# Patient Record
Sex: Female | Born: 1955 | Race: White | Hispanic: No | Smoking: Former smoker
Health system: Southern US, Community
[De-identification: ages and names within clinical notes are randomized; demographics above are authoritative.]

## PROBLEM LIST (undated history)

## (undated) DIAGNOSIS — D649 Anemia, unspecified: Secondary | ICD-10-CM

## (undated) DIAGNOSIS — E039 Hypothyroidism, unspecified: Secondary | ICD-10-CM

## (undated) DIAGNOSIS — I10 Essential (primary) hypertension: Secondary | ICD-10-CM

## (undated) DIAGNOSIS — E785 Hyperlipidemia, unspecified: Secondary | ICD-10-CM

## (undated) DIAGNOSIS — K5792 Diverticulitis of intestine, part unspecified, without perforation or abscess without bleeding: Secondary | ICD-10-CM

## (undated) DIAGNOSIS — C801 Malignant (primary) neoplasm, unspecified: Secondary | ICD-10-CM

## (undated) DIAGNOSIS — N189 Chronic kidney disease, unspecified: Secondary | ICD-10-CM

## (undated) DIAGNOSIS — E119 Type 2 diabetes mellitus without complications: Secondary | ICD-10-CM

## (undated) DIAGNOSIS — K219 Gastro-esophageal reflux disease without esophagitis: Secondary | ICD-10-CM

## (undated) HISTORY — PX: HERNIA REPAIR: SHX51

## (undated) HISTORY — PX: PARATHYROIDECTOMY: SHX19

## (undated) HISTORY — PX: LITHOTRIPSY: SUR834

## (undated) HISTORY — PX: COLON SURGERY: SHX602

## (undated) HISTORY — PX: THYROIDECTOMY: SHX17

---

## 1997-07-04 HISTORY — PX: TOTAL ABDOMINAL HYSTERECTOMY: SHX209

## 1997-07-04 HISTORY — PX: ABDOMINAL HYSTERECTOMY: SHX81

## 2005-06-22 ENCOUNTER — Emergency Department: Payer: Self-pay | Admitting: Internal Medicine

## 2006-03-20 ENCOUNTER — Ambulatory Visit: Payer: Self-pay | Admitting: Internal Medicine

## 2006-03-20 ENCOUNTER — Ambulatory Visit: Payer: Self-pay | Admitting: Urology

## 2006-04-06 ENCOUNTER — Ambulatory Visit: Payer: Self-pay | Admitting: Urology

## 2008-03-25 ENCOUNTER — Ambulatory Visit: Payer: Self-pay | Admitting: Gastroenterology

## 2009-07-04 HISTORY — PX: SIGMOIDECTOMY: SHX176

## 2010-01-22 ENCOUNTER — Ambulatory Visit: Payer: Self-pay | Admitting: Obstetrics and Gynecology

## 2010-03-19 ENCOUNTER — Ambulatory Visit: Payer: Self-pay | Admitting: Surgery

## 2010-03-22 LAB — PATHOLOGY REPORT

## 2010-03-30 ENCOUNTER — Ambulatory Visit: Payer: Self-pay | Admitting: Surgery

## 2010-04-22 ENCOUNTER — Inpatient Hospital Stay: Payer: Self-pay | Admitting: Surgery

## 2010-04-23 LAB — PATHOLOGY REPORT

## 2010-06-03 ENCOUNTER — Ambulatory Visit: Payer: Self-pay | Admitting: Urology

## 2010-06-29 ENCOUNTER — Ambulatory Visit: Payer: Self-pay | Admitting: Urology

## 2010-07-04 DIAGNOSIS — C801 Malignant (primary) neoplasm, unspecified: Secondary | ICD-10-CM

## 2010-07-04 HISTORY — DX: Malignant (primary) neoplasm, unspecified: C80.1

## 2011-02-22 ENCOUNTER — Ambulatory Visit: Payer: Self-pay | Admitting: Surgery

## 2011-05-11 ENCOUNTER — Ambulatory Visit: Payer: Self-pay | Admitting: Surgery

## 2011-05-16 LAB — PATHOLOGY REPORT

## 2011-06-06 ENCOUNTER — Ambulatory Visit: Payer: Self-pay | Admitting: Family Medicine

## 2012-03-14 ENCOUNTER — Ambulatory Visit: Payer: Self-pay | Admitting: Surgery

## 2012-03-14 DIAGNOSIS — I10 Essential (primary) hypertension: Secondary | ICD-10-CM

## 2012-03-21 ENCOUNTER — Ambulatory Visit: Payer: Self-pay | Admitting: Surgery

## 2012-06-06 ENCOUNTER — Ambulatory Visit: Payer: Self-pay | Admitting: Family Medicine

## 2013-06-07 ENCOUNTER — Ambulatory Visit: Payer: Self-pay | Admitting: Family Medicine

## 2014-08-27 ENCOUNTER — Ambulatory Visit: Payer: Self-pay | Admitting: Family Medicine

## 2014-10-02 ENCOUNTER — Ambulatory Visit
Admit: 2014-10-02 | Disposition: A | Discharge status: Home / Self Care | Payer: Self-pay | Attending: Otolaryngology | Admitting: Otolaryngology

## 2014-10-15 ENCOUNTER — Ambulatory Visit
Admit: 2014-10-15 | Disposition: A | Discharge status: Home / Self Care | Payer: Self-pay | Attending: Otolaryngology | Admitting: Otolaryngology

## 2014-10-21 NOTE — Op Note (Signed)
PATIENT NAME:  Susan Pineda, Susan Pineda MR#:  [id number] DATE OF BIRTH:  [birth year]  DATE OF PROCEDURE:  03/21/2012  PREOPERATIVE DIAGNOSIS: Recurrent ventral hernia.  POSTOPERATIVE DIAGNOSIS: Recurrent ventral hernia.   PROCEDURE: Repair of recurrent ventral hernia.   SURGEON: Rochel Brome, MD  ANESTHESIA: General.   INDICATIONS: This 59 year old female recently came in with bulging in the lower abdomen. She had had a prior hysterectomy and prior ventral hernia repair with Gore-Tex Bio-A mesh. She preferred not to have any permanent mesh used. She had bulging demonstrated on physical exam and repair was recommended for definitive treatment.   DESCRIPTION OF PROCEDURE: The patient was placed on the operating table in the supine position under general anesthesia. The abdomen was prepared with ChloraPrep and draped in a sterile manner.   A lower abdominal midline incision was made at the site of an old scar, carried down through subcutaneous tissues, extended down approximately 4 cm to encounter a ventral hernia sac which was dissected free from surrounding structures. It was separated from surrounding fascia and a portion of the sac was excised and did not need to be sent for pathology and the defect remaining was closed with 2-0 Vicryl. The peritoneum was pushed back away from the rectus muscle bilaterally and also dissected down just below the pubic tubercle and up to the upper extent of the fascial defect proximally. It appeared that the defect was approximately 7 cm in dimension from top to bottom. It appeared that the rectus muscles could be approximated in the midline. A 7 x 10 cm Gore-Tex Bio-A mesh was selected. It was cut to create an oval shape. There was a U-shaped defect in the mesh which was repaired with trimmed corners using 0 Surgilon. The mesh was placed into the properitoneal plane. It was sutured to the deep fascia circumferentially and it was sutured just posterior to the pubic tubercle  inferiorly and used through and through 2-0 Vicryl ligatures proximally. Next, the fascia was closed in the midline with interrupted 0 Surgilon. The repair looked good. Some bleeding points were cauterized during the course of the procedure. Hemostasis was subsequently intact. Subcutaneous tissues were closed with 5-0 Monocryl. The skin was closed with running 4-0 Monocryl subcuticular suture and Dermabond. The patient tolerated surgery satisfactorily and was then prepared for transfer to the Recovery Room. ____________________________ Lenna Sciara. Rochel Brome, MD jws:slb D: 03/21/2012 09:51:00 ET T: 03/21/2012 10:23:08 ET JOB#: [id number]  cc: Loreli Dollar, MD, <Dictator> Loreli Dollar MD ELECTRONICALLY SIGNED 03/22/2012 18:23

## 2014-12-08 ENCOUNTER — Encounter: Payer: Self-pay | Admitting: *Deleted

## 2014-12-08 ENCOUNTER — Inpatient Hospital Stay: Admission: RE | Admit: 2014-12-08 | Payer: Self-pay | Source: Ambulatory Visit

## 2014-12-08 DIAGNOSIS — I10 Essential (primary) hypertension: Secondary | ICD-10-CM | POA: Diagnosis not present

## 2014-12-08 DIAGNOSIS — E785 Hyperlipidemia, unspecified: Secondary | ICD-10-CM | POA: Diagnosis not present

## 2014-12-08 DIAGNOSIS — R221 Localized swelling, mass and lump, neck: Secondary | ICD-10-CM | POA: Diagnosis present

## 2014-12-08 DIAGNOSIS — E119 Type 2 diabetes mellitus without complications: Secondary | ICD-10-CM | POA: Diagnosis not present

## 2014-12-08 DIAGNOSIS — E039 Hypothyroidism, unspecified: Secondary | ICD-10-CM | POA: Diagnosis not present

## 2014-12-08 DIAGNOSIS — K118 Other diseases of salivary glands: Secondary | ICD-10-CM | POA: Diagnosis not present

## 2014-12-08 DIAGNOSIS — Z79899 Other long term (current) drug therapy: Secondary | ICD-10-CM | POA: Diagnosis not present

## 2014-12-08 NOTE — Patient Instructions (Addendum)
  Your procedure is scheduled on: 12-18-14 Report to [facility] To find out your arrival time please call [phone] between 1PM - 3PM on 12-17-14 [facility])  Remember: Instructions that are not followed completely may result in serious medical risk, up to and including death, or upon the discretion of your surgeon and anesthesiologist your surgery may need to be rescheduled.    _X___ 1. Do not eat food or drink liquids after midnight. No gum chewing or hard candies.     _X___ 2. No Alcohol for 24 hours before or after surgery.   ____ 3. Bring all medications with you on the day of surgery if instructed.    _X___ 4. Notify your doctor if there is any change in your medical condition     (cold, fever, infections).     Do not wear jewelry, make-up, hairpins, clips or nail polish.  Do not wear lotions, powders, or perfumes. You may wear deodorant.  Do not shave 48 hours prior to surgery. Men may shave face and neck.  Do not bring valuables to the hospital.    [facility] is not responsible for any belongings or valuables.               Contacts, dentures or bridgework may not be worn into surgery.  Leave your suitcase in the car. After surgery it may be brought to your room.  For patients admitted to the hospital, discharge time is determined by your treatment team.   Patients discharged the day of surgery will not be allowed to drive home.   Please read over the following fact sheets that you were given:     _X___ Take these medicines the morning of surgery with A SIP OF WATER:    1.LISINOPRIL  2.SYNTHROID  3.   4.  5.  6.  ____ Fleet Enema (as directed)   _X___ Use CHG Soap as directed  ____ Use inhalers on the day of surgery  _X___ Stop metformin 2 days prior to surgery    ____ Take 1/2 of usual insulin dose the night before surgery and none on the morning of surgery.   _X___Stop Coumadin/Plavix/aspirin 7 DAYS PRIOR TO SURGERY  ___     Stop Anti-inflammatories-NO NSAIDS OR ASPIRIN PRODUCTS-TYLENOL OK   _X___ Stop supplements until after surgery-STOP PROBIOTIC, CINNAMON, AND CRANBERRY 7 DAYS PRIOR TO SURGERY  ____ Bring C-Pap to the hospital.

## 2014-12-09 ENCOUNTER — Other Ambulatory Visit: Payer: Self-pay

## 2014-12-11 ENCOUNTER — Encounter
Admission: RE | Admit: 2014-12-11 | Discharge: 2014-12-11 | Disposition: A | Discharge status: Home / Self Care | Payer: 59 | Source: Ambulatory Visit | Attending: Anesthesiology | Admitting: Anesthesiology

## 2014-12-11 DIAGNOSIS — E119 Type 2 diabetes mellitus without complications: Secondary | ICD-10-CM | POA: Diagnosis not present

## 2014-12-11 DIAGNOSIS — Z9889 Other specified postprocedural states: Secondary | ICD-10-CM | POA: Diagnosis not present

## 2014-12-11 DIAGNOSIS — E785 Hyperlipidemia, unspecified: Secondary | ICD-10-CM | POA: Diagnosis not present

## 2014-12-11 DIAGNOSIS — I10 Essential (primary) hypertension: Secondary | ICD-10-CM | POA: Diagnosis not present

## 2014-12-11 DIAGNOSIS — Z01810 Encounter for preprocedural cardiovascular examination: Secondary | ICD-10-CM | POA: Diagnosis not present

## 2014-12-11 DIAGNOSIS — Z01812 Encounter for preprocedural laboratory examination: Secondary | ICD-10-CM | POA: Diagnosis present

## 2014-12-11 DIAGNOSIS — N189 Chronic kidney disease, unspecified: Secondary | ICD-10-CM | POA: Insufficient documentation

## 2014-12-11 DIAGNOSIS — N200 Calculus of kidney: Secondary | ICD-10-CM | POA: Insufficient documentation

## 2014-12-11 DIAGNOSIS — E890 Postprocedural hypothyroidism: Secondary | ICD-10-CM | POA: Diagnosis not present

## 2014-12-11 DIAGNOSIS — C73 Malignant neoplasm of thyroid gland: Secondary | ICD-10-CM | POA: Diagnosis not present

## 2014-12-11 LAB — BASIC METABOLIC PANEL
Anion gap: 6 (ref 5–15)
BUN: 14 mg/dL (ref 6–20)
CO2: 27 mmol/L (ref 22–32)
Calcium: 8.7 mg/dL — ABNORMAL LOW (ref 8.9–10.3)
Chloride: 106 mmol/L (ref 101–111)
Creatinine, Ser: 0.78 mg/dL (ref 0.44–1.00)
GFR calc Af Amer: >60 mL/min (ref >60)
GLUCOSE: 100 mg/dL — AB (ref 65–99)
POTASSIUM: 4.3 mmol/L (ref 3.5–5.1)
SODIUM: 139 mmol/L (ref 135–145)

## 2014-12-18 ENCOUNTER — Encounter: Payer: Self-pay | Admitting: *Deleted

## 2014-12-18 ENCOUNTER — Observation Stay
Admission: RE | Admit: 2014-12-18 | Discharge: 2014-12-18 | Disposition: A | Discharge status: Home / Self Care | Payer: 59 | Source: Ambulatory Visit | Attending: Otolaryngology | Admitting: Otolaryngology

## 2014-12-18 ENCOUNTER — Ambulatory Visit: Payer: 59 | Admitting: Anesthesiology

## 2014-12-18 ENCOUNTER — Encounter
Admission: RE | Disposition: A | Discharge status: Home / Self Care | Payer: Self-pay | Source: Ambulatory Visit | Attending: Otolaryngology

## 2014-12-18 DIAGNOSIS — E039 Hypothyroidism, unspecified: Secondary | ICD-10-CM | POA: Insufficient documentation

## 2014-12-18 DIAGNOSIS — I10 Essential (primary) hypertension: Secondary | ICD-10-CM | POA: Insufficient documentation

## 2014-12-18 DIAGNOSIS — Z9089 Acquired absence of other organs: Secondary | ICD-10-CM

## 2014-12-18 DIAGNOSIS — Z9889 Other specified postprocedural states: Secondary | ICD-10-CM

## 2014-12-18 DIAGNOSIS — Z79899 Other long term (current) drug therapy: Secondary | ICD-10-CM | POA: Insufficient documentation

## 2014-12-18 DIAGNOSIS — K118 Other diseases of salivary glands: Secondary | ICD-10-CM | POA: Diagnosis not present

## 2014-12-18 DIAGNOSIS — E119 Type 2 diabetes mellitus without complications: Secondary | ICD-10-CM | POA: Insufficient documentation

## 2014-12-18 DIAGNOSIS — E785 Hyperlipidemia, unspecified: Secondary | ICD-10-CM | POA: Insufficient documentation

## 2014-12-18 HISTORY — DX: Essential (primary) hypertension: I10

## 2014-12-18 HISTORY — DX: Chronic kidney disease, unspecified: N18.9

## 2014-12-18 HISTORY — DX: Hypothyroidism, unspecified: E03.9

## 2014-12-18 HISTORY — DX: Malignant (primary) neoplasm, unspecified: C80.1

## 2014-12-18 HISTORY — DX: Hyperlipidemia, unspecified: E78.5

## 2014-12-18 HISTORY — DX: Diverticulitis of intestine, part unspecified, without perforation or abscess without bleeding: K57.92

## 2014-12-18 HISTORY — DX: Type 2 diabetes mellitus without complications: E11.9

## 2014-12-18 HISTORY — PX: SUBMANDIBULAR GLAND EXCISION: SHX2456

## 2014-12-18 LAB — GLUCOSE, CAPILLARY
GLUCOSE-CAPILLARY: 108 mg/dL — AB (ref 65–99)
Glucose-Capillary: 113 mg/dL — ABNORMAL HIGH (ref 65–99)

## 2014-12-18 SURGERY — EXCISION, SUBMANDIBULAR GLAND
Anesthesia: General | Laterality: Right | Wound class: Clean

## 2014-12-18 MED ORDER — LEVOTHYROXINE SODIUM 50 MCG PO TABS: 100.0000 ug | ORAL_TABLET | Freq: Every day | ORAL | Status: DC

## 2014-12-18 MED ORDER — LIDOCAINE-EPINEPHRINE 1 %-1:100000 IJ SOLN
INTRAMUSCULAR | Status: DC | PRN
  Administered 2014-12-18: 5 mL

## 2014-12-18 MED ORDER — LACTATED RINGERS IV SOLN
INTRAVENOUS | Status: DC | PRN
  Administered 2014-12-18: 08:00:00 via INTRAVENOUS

## 2014-12-18 MED ORDER — PROPOFOL 10 MG/ML IV BOLUS
INTRAVENOUS | Status: DC | PRN
  Administered 2014-12-18: 150 mg via INTRAVENOUS
  Administered 2014-12-18: 25 mg via INTRAVENOUS
  Administered 2014-12-18: 30 mg via INTRAVENOUS

## 2014-12-18 MED ORDER — HYDROCODONE-ACETAMINOPHEN 5-325 MG PO TABS: 1.0000 | ORAL_TABLET | ORAL | Status: DC | PRN

## 2014-12-18 MED ORDER — FAMOTIDINE 20 MG PO TABS
20.0000 mg | ORAL_TABLET | Freq: Once | ORAL | Status: AC
  Administered 2014-12-18: 20 mg via ORAL

## 2014-12-18 MED ORDER — PRAVASTATIN SODIUM 20 MG PO TABS: 40.0000 mg | ORAL_TABLET | Freq: Every day | ORAL | Status: DC

## 2014-12-18 MED ORDER — MORPHINE SULFATE 2 MG/ML IJ SOLN: 2.0000 mg | INTRAMUSCULAR | Status: DC | PRN

## 2014-12-18 MED ORDER — CANAGLIFLOZIN 100 MG PO TABS
300.0000 mg | ORAL_TABLET | Freq: Every day | ORAL | Status: DC
  Filled 2014-12-18: qty 3

## 2014-12-18 MED ORDER — SODIUM CHLORIDE 0.9 % IV SOLN
Freq: Once | INTRAVENOUS | Status: AC
  Administered 2014-12-18: 50 mL/h via INTRAVENOUS

## 2014-12-18 MED ORDER — LIDOCAINE HCL (CARDIAC) 20 MG/ML IV SOLN
INTRAVENOUS | Status: DC | PRN
  Administered 2014-12-18: 30 mg via INTRAVENOUS

## 2014-12-18 MED ORDER — DEXTROSE-NACL 5-0.45 % IV SOLN
INTRAVENOUS | Status: DC
  Administered 2014-12-18: 11:00:00 via INTRAVENOUS

## 2014-12-18 MED ORDER — SUCCINYLCHOLINE CHLORIDE 20 MG/ML IJ SOLN
INTRAMUSCULAR | Status: DC | PRN
  Administered 2014-12-18: 80 mg via INTRAVENOUS

## 2014-12-18 MED ORDER — FENTANYL CITRATE (PF) 100 MCG/2ML IJ SOLN
INTRAMUSCULAR | Status: DC | PRN
  Administered 2014-12-18: 50 ug via INTRAVENOUS
  Administered 2014-12-18: 100 ug via INTRAVENOUS

## 2014-12-18 MED ORDER — PROPOFOL INFUSION 10 MG/ML OPTIME
INTRAVENOUS | Status: DC | PRN
  Administered 2014-12-18: 25 ug/kg/min via INTRAVENOUS

## 2014-12-18 MED ORDER — EPHEDRINE SULFATE 50 MG/ML IJ SOLN
INTRAMUSCULAR | Status: DC | PRN
  Administered 2014-12-18 (×2): 10 mg via INTRAVENOUS

## 2014-12-18 MED ORDER — FENTANYL CITRATE (PF) 100 MCG/2ML IJ SOLN
25.0000 ug | INTRAMUSCULAR | Status: DC | PRN
  Administered 2014-12-18 (×2): 25 ug via INTRAVENOUS

## 2014-12-18 MED ORDER — AMOXICILLIN-POT CLAVULANATE 875-125 MG PO TABS: 1.0000 | ORAL_TABLET | Freq: Two times a day (BID) | ORAL | Status: DC

## 2014-12-18 MED ORDER — LISINOPRIL 5 MG PO TABS: 5.0000 mg | ORAL_TABLET | ORAL | Status: DC

## 2014-12-18 MED ORDER — PHENYLEPHRINE HCL 10 MG/ML IJ SOLN
INTRAMUSCULAR | Status: DC | PRN
  Administered 2014-12-18 (×2): 100 ug via INTRAVENOUS

## 2014-12-18 MED ORDER — ONDANSETRON HCL 4 MG/2ML IJ SOLN
INTRAMUSCULAR | Status: DC | PRN
  Administered 2014-12-18: 4 mg via INTRAVENOUS

## 2014-12-18 MED ORDER — MIDAZOLAM HCL 2 MG/2ML IJ SOLN
INTRAMUSCULAR | Status: DC | PRN
Start: 2014-12-18 — End: 2014-12-18
  Administered 2014-12-18: 2 mg via INTRAVENOUS

## 2014-12-18 MED ORDER — GLYCOPYRROLATE 0.2 MG/ML IJ SOLN
INTRAMUSCULAR | Status: DC | PRN
  Administered 2014-12-18: 0.2 mg via INTRAVENOUS

## 2014-12-18 MED ORDER — ONDANSETRON HCL 4 MG/2ML IJ SOLN: 4.0000 mg | Freq: Once | INTRAMUSCULAR | Status: DC | PRN

## 2014-12-18 MED ORDER — ONDANSETRON HCL 4 MG/2ML IJ SOLN: 4.0000 mg | Freq: Four times a day (QID) | INTRAMUSCULAR | Status: DC | PRN

## 2014-12-18 MED ORDER — FAMOTIDINE 20 MG PO TABS
ORAL_TABLET | ORAL | Status: AC
  Administered 2014-12-18: 20 mg via ORAL
  Filled 2014-12-18: qty 1

## 2014-12-18 MED ORDER — ALBUTEROL SULFATE HFA 108 (90 BASE) MCG/ACT IN AERS
INHALATION_SPRAY | RESPIRATORY_TRACT | Status: DC | PRN
  Administered 2014-12-18: 6 via RESPIRATORY_TRACT

## 2014-12-18 MED ORDER — PROMETHAZINE HCL 12.5 MG PO TABS: 12.5000 mg | ORAL_TABLET | Freq: Four times a day (QID) | ORAL | Status: DC | PRN

## 2014-12-18 MED ORDER — METFORMIN HCL 500 MG PO TABS: 500.0000 mg | ORAL_TABLET | Freq: Two times a day (BID) | ORAL | Status: DC

## 2014-12-18 MED ORDER — LIDOCAINE-EPINEPHRINE 1 %-1:100000 IJ SOLN
INTRAMUSCULAR | Status: AC
Start: 2014-12-18 — End: 2014-12-18
  Filled 2014-12-18: qty 1

## 2014-12-18 MED ORDER — DEXAMETHASONE SODIUM PHOSPHATE 4 MG/ML IJ SOLN
INTRAMUSCULAR | Status: DC | PRN
Start: 2014-12-18 — End: 2014-12-18
  Administered 2014-12-18: 8 mg via INTRAVENOUS

## 2014-12-18 MED ORDER — FENTANYL CITRATE (PF) 100 MCG/2ML IJ SOLN
INTRAMUSCULAR | Status: AC
  Administered 2014-12-18: 25 ug via INTRAVENOUS
  Filled 2014-12-18: qty 2

## 2014-12-18 SURGICAL SUPPLY — 35 items
BLADE SURG 15 STRL LF DISP TIS (BLADE) ×1 IMPLANT
BLADE SURG 15 STRL SS (BLADE) ×2
CANISTER SUCT 1200ML W/VALVE (MISCELLANEOUS) ×3 IMPLANT
CLOSURE WOUND 1/4X4 (GAUZE/BANDAGES/DRESSINGS) ×1
CORD BIP STRL DISP 12FT (MISCELLANEOUS) ×3 IMPLANT
DRAIN TLS ROUND 10FR (DRAIN) ×3 IMPLANT
DRAPE MAG INST 16X20 L/F (DRAPES) ×3 IMPLANT
DRSG TEGADERM 2-3/8X2-3/4 SM (GAUZE/BANDAGES/DRESSINGS) ×3 IMPLANT
ELECT NEEDLE 20X.3 GREEN (MISCELLANEOUS) ×3
ELECTRODE NEEDLE 20X.3 GREEN (MISCELLANEOUS) ×1 IMPLANT
FORCEPS JEWEL BIP 4-3/4 STR (INSTRUMENTS) ×3 IMPLANT
GLOVE BIO SURGEON STRL SZ7.5 (GLOVE) ×12 IMPLANT
GOWN STRL REUS W/ TWL LRG LVL3 (GOWN DISPOSABLE) ×3 IMPLANT
GOWN STRL REUS W/TWL LRG LVL3 (GOWN DISPOSABLE) ×6
HARMONIC SCALPEL FOCUS (MISCELLANEOUS) ×3 IMPLANT
HEMOSTAT SURGICEL 2X3 (HEMOSTASIS) ×3 IMPLANT
LABEL OR SOLS (LABEL) ×3 IMPLANT
LIQUID BAND (GAUZE/BANDAGES/DRESSINGS) ×3 IMPLANT
NS IRRIG 500ML POUR BTL (IV SOLUTION) ×3 IMPLANT
PACK HEAD/NECK (MISCELLANEOUS) ×3 IMPLANT
PAD GROUND ADULT SPLIT (MISCELLANEOUS) ×3 IMPLANT
PROBE MONO 100X0.75 ELECT 1.9M (MISCELLANEOUS) ×3 IMPLANT
RETRACTOR STAY HOOK 5MM (MISCELLANEOUS) ×3 IMPLANT
SPONGE KITTNER 5P (MISCELLANEOUS) ×9 IMPLANT
SPONGE XRAY 4X4 16PLY STRL (MISCELLANEOUS) IMPLANT
STRIP CLOSURE SKIN 1/4X4 (GAUZE/BANDAGES/DRESSINGS) ×2 IMPLANT
SUT ETHILON 4-0 (SUTURE)
SUT ETHILON 4-0 FS2 18XMFL BLK (SUTURE)
SUT SILK 2 0 (SUTURE) ×2
SUT SILK 2 0 SH (SUTURE) IMPLANT
SUT SILK 2-0 18XBRD TIE 12 (SUTURE) ×1 IMPLANT
SUT VIC AB 4-0 RB1 18 (SUTURE) ×3 IMPLANT
SUTURE ETHLN 4-0 FS2 18XMF BLK (SUTURE) IMPLANT
SYSTEM CHEST DRAIN TLS 7FR (DRAIN) IMPLANT
TOWEL OR 17X26 4PK STRL BLUE (TOWEL DISPOSABLE) ×3 IMPLANT

## 2014-12-18 NOTE — Anesthesia Postprocedure Evaluation (Signed)
  Anesthesia Post-op Note  Patient: Susan Pineda  Procedure(s) Performed: Procedure(s): Right submandibular gland excision (Right)  Anesthesia type:General  Patient location: PACU  Post pain: Pain level controlled  Post assessment: Post-op Vital signs reviewed, Patient's Cardiovascular Status Stable, Respiratory Function Stable, Patent Airway and No signs of Nausea or vomiting  Post vital signs: Reviewed and stable  Last Vitals:  Filed Vitals:   12/18/14 1228  BP: 115/64  Pulse: 58  Temp:   Resp:     Level of consciousness: awake, alert  and patient cooperative  Complications: No apparent anesthesia complications

## 2014-12-18 NOTE — Anesthesia Preprocedure Evaluation (Addendum)
Anesthesia Evaluation  Patient identified by MRN, date of birth, ID band Patient awake    Reviewed: Allergy & Precautions, NPO status , Patient's Chart, lab work & pertinent test results  Airway Mallampati: III  TM Distance: >3 FB     Dental  (+) Chipped   Pulmonary Current Smoker,          Cardiovascular hypertension,     Neuro/Psych    GI/Hepatic   Endo/Other  diabetesHypothyroidism   Renal/GU Renal InsufficiencyRenal disease     Musculoskeletal   Abdominal   Peds  Hematology   Anesthesia Other Findings   Reproductive/Obstetrics                            Anesthesia Physical Anesthesia Plan  ASA: II  Anesthesia Plan: General   Post-op Pain Management:    Induction: Intravenous  Airway Management Planned: Oral ETT  Additional Equipment:   Intra-op Plan:   Post-operative Plan:   Informed Consent: I have reviewed the patients History and Physical, chart, labs and discussed the procedure including the risks, benefits and alternatives for the proposed anesthesia with the patient or authorized representative who has indicated his/her understanding and acceptance.     Plan Discussed with: CRNA  Anesthesia Plan Comments:         Anesthesia Quick Evaluation

## 2014-12-18 NOTE — Op Note (Signed)
..  12/18/2014  9:07 AM    Susan Pineda  [id number]   Pre-Op Dx:  Right submandibular mass  Post-op Dx: Same  Proc: Right submandibular gland excision   Surg:  Carloyn Manner   Assistant:  Tami Ribas, Chap  Anes:  GOT  EBL:  5cc  Comp:  None  Findings:  Right submandibular gland and enlarged right sublingual gland excised.  Right lingual and hypoglossal nerve identified and preserved  Procedure: After the patient was identified in holding and the consent and h&p were reviewed, the patient was taken to the operating room and placed in a supine position.  General endotracheal anesthesia was induced in the normal fashion without long term paralytic agent.  The patient was prepped and draped in the normal fashion after injection of 5ccs of 1% Lidocaine with 1:100,000 epi.  The patient's right neck was evaluated and an incision was placed approximately 2 finger breaths below the angle of the mandible in a natural skin crease near the inferior border of the submandibular gland with a 15 blade.  Dissection was carried through the platysma muscle with combination of Bovie as well as harmonic scalpel.  The submandibular gland fascia was encountered and care was taken to not injure the marginal mandibular branch of the facial nerve.  The anterior facial vein was encountered and ligated near the inferior border of the submandibular gland.  The fascia and vein were then retracted superiorly to protect the marginal mandibular branch.  A combination of blunt techniques and harmonic scalpel were used to circumferentially dissect around the gland.  The facial artery branch to the submandibular gland was ligated and divided.  The mylohyoid muscle was identified and retracted anteriorly and the submandibular gland was continued to be dissected until it was pedicled on the duct as well as the submandibular ganglion.  The lingual nerve was identified and the submandibular ganglion was divided at the gland  capsule.  The hypoglossal was identified and stimulated robustly.  The duct was traced anteriorly and divided with harmonic scalpel and the gland was removed.  After removal of the submandibular gland, palpation revealed persistent fullness in the area of the sublingual.  Due to possibility of accessory sublingual gland on MRI and patient's location of mass being in anterior aspect of the submandibular triangle, it was decided to remove the sublingual gland was well.  At this time a babcock retractor was placed on the sublingual gland and this was retracted inferiorly.  Blunt dissection was used with a Kitner to separate the gland from the floor of mouth tissue.  The lingual nerve coursed through the sublingual gland and was carefully dissected away and the large accessory sublingual gland was removed from the patient's neck after transection of its remaining attachments with the Harmonic scalpel.  At this time the wound was evaluated.  The lingual nerve and hypoglossal nerve were intact in their normal location.  The wound was copiously irrigated with sterile saline.  A TLS drain was placed in the patient's wound and exited from just posterior to the incision.  The wound was closed in layers with re approximation of the platysma and then subdermal skin to release tension from the skin.  The skin was closed with skin glue and topped with a steri strip.   Dispo:   To PACU in good condition  Plan:  Admit for observation and care of drain  [name]  12/18/2014 9:07 AM

## 2014-12-18 NOTE — H&P (Signed)
..  History and Physical paper copy reviewed and updated date of procedure and will be scanned into system.  

## 2014-12-18 NOTE — Progress Notes (Signed)
..   12/18/2014 12:32 PM  Susan Pineda [id number]  Post-Op Day 0    Temp:  [97.2 F (36.2 C)-98.3 F (36.8 C)] 97.8 F (36.6 C) (06/16 1127) Pulse Rate:  [33-113] 58 (06/16 1228) Resp:  [14-30] 16 (06/16 1100) BP: (104-123)/(57-80) 115/64 mmHg (06/16 1228) SpO2:  [93 %-97 %] 96 % (06/16 1228) Weight:  [88.905 kg (196 lb)] 88.905 kg (196 lb) (06/16 0616),     Intake/Output Summary (Last 24 hours) at 12/18/14 1232 Last data filed at 12/18/14 1032  Gross per 24 hour  Intake   1000 ml  Output      5 ml  Net    995 ml    Results for orders placed or performed during the hospital encounter of 12/18/14 (from the past 24 hour(s))  Glucose, capillary     Status: Abnormal   Collection Time: 12/18/14  6:10 AM  Result Value Ref Range   Glucose-Capillary 113 (H) 65 - 99 mg/dL   Comment 1 Notify RN   Glucose, capillary     Status: Abnormal   Collection Time: 12/18/14  9:50 AM  Result Value Ref Range   Glucose-Capillary 108 (H) 65 - 99 mg/dL    SUBJECTIVE:  No acute events since surgery.  Tolerating PO.  Ambulating.  No breathing difficulty.  OBJECTIVE:  Gen- NAD, Alert and oriented Neck- incision dressed and c/d/i.  Drain in place with scant bloodly drainage  IMPRESSION:  S/p right submandibular/sublingual gland excision POD#0  PLAN:  Follow drain output.  Possible d/c home later today if continues to do well.  Susumu Hackler 12/18/2014, 12:32 PM

## 2014-12-18 NOTE — Progress Notes (Signed)
Pt A&O. VSS. Prescription given, concerns addressed. Discharge summary given. IV site removed. Additional tubing provided.

## 2014-12-18 NOTE — Transfer of Care (Signed)
Immediate Anesthesia Transfer of Care Note  Patient: Susan Pineda  Procedure(s) Performed: Procedure(s): Right submandibular gland excision (Right)  Patient Location: PACU  Anesthesia Type:General  Level of Consciousness: alert   Airway & Oxygen Therapy: Patient Spontanous Breathing and Patient connected to face mask oxygen  Post-op Assessment: Report given to RN and Post -op Vital signs reviewed and stable  Post vital signs: Reviewed and stable  Last Vitals:  Filed Vitals:   12/18/14 0920  BP: 123/62  Pulse: 70  Temp: 36.2 C  Resp: 30    Complications: No apparent anesthesia complications

## 2014-12-19 LAB — SURGICAL PATHOLOGY

## 2015-01-27 ENCOUNTER — Other Ambulatory Visit: Payer: Self-pay | Admitting: Family Medicine

## 2015-03-03 ENCOUNTER — Other Ambulatory Visit: Payer: Self-pay | Admitting: Family Medicine

## 2015-03-04 ENCOUNTER — Encounter: Payer: Self-pay | Admitting: Family Medicine

## 2015-03-04 ENCOUNTER — Encounter: Payer: Self-pay | Admitting: *Deleted

## 2015-03-04 ENCOUNTER — Ambulatory Visit (INDEPENDENT_AMBULATORY_CARE_PROVIDER_SITE_OTHER): Payer: 59 | Admitting: Family Medicine

## 2015-03-04 VITALS — BP 96/64 | HR 67 | Temp 99.2°F | Ht 61.1 in | Wt 194.0 lb

## 2015-03-04 DIAGNOSIS — Z6836 Body mass index (BMI) 36.0-36.9, adult: Secondary | ICD-10-CM | POA: Diagnosis not present

## 2015-03-04 DIAGNOSIS — E119 Type 2 diabetes mellitus without complications: Secondary | ICD-10-CM | POA: Diagnosis not present

## 2015-03-04 DIAGNOSIS — E1159 Type 2 diabetes mellitus with other circulatory complications: Secondary | ICD-10-CM | POA: Insufficient documentation

## 2015-03-04 DIAGNOSIS — E1169 Type 2 diabetes mellitus with other specified complication: Secondary | ICD-10-CM | POA: Insufficient documentation

## 2015-03-04 DIAGNOSIS — K432 Incisional hernia without obstruction or gangrene: Secondary | ICD-10-CM | POA: Insufficient documentation

## 2015-03-04 DIAGNOSIS — E782 Mixed hyperlipidemia: Secondary | ICD-10-CM

## 2015-03-04 DIAGNOSIS — M8580 Other specified disorders of bone density and structure, unspecified site: Secondary | ICD-10-CM

## 2015-03-04 DIAGNOSIS — E785 Hyperlipidemia, unspecified: Secondary | ICD-10-CM | POA: Diagnosis not present

## 2015-03-04 DIAGNOSIS — I152 Hypertension secondary to endocrine disorders: Secondary | ICD-10-CM | POA: Insufficient documentation

## 2015-03-04 DIAGNOSIS — I10 Essential (primary) hypertension: Secondary | ICD-10-CM

## 2015-03-04 NOTE — Assessment & Plan Note (Signed)
The current medical regimen is effective;  continue present plan and medications.  

## 2015-03-04 NOTE — Assessment & Plan Note (Signed)
Stress with patient difficulty with weight loss and surgery on her parotid and submandibular gland Patient will double down on weight loss with goal of 10% body weight by next year.

## 2015-03-04 NOTE — Progress Notes (Signed)
BP 96/64 mmHg  Pulse 67  Temp(Src) 99.2 F (37.3 C)  Ht 5' 1.1" (1.552 m)  Wt 194 lb (87.998 kg)  BMI 36.53 kg/m2   Subjective:    Patient ID: Susan Pineda, female    DOB: 13-Sep-1955, 59 y.o.   MRN: [id number]  HPI: Susan Pineda is a 59 y.o. female  Chief Complaint  Patient presents with  . Diabetes  . Hyperlipidemia  . Hypertension   patient follow-up doing well diabetes noted low blood sugar spells no problems with medications taking blood pressure and cholesterol medicines with good control also.  Patient also had difficult time losing weight this year due to parotid gland surgery she had this spring. She has recovered well and now ready to start an exercise program along with weight-loss program.  Patient also with midline surgery from previous surgery which been repaired 2 times previously. Keeps coming back becomes uncomfortable though and wants to have it checked again.  Relevant past medical, surgical, family and social history reviewed and updated as indicated. Interim medical history since our last visit reviewed. Allergies and medications reviewed and updated.  Review of Systems  Constitutional: Negative.   Respiratory: Negative.   Cardiovascular: Negative.     Per HPI unless specifically indicated above     Objective:    BP 96/64 mmHg  Pulse 67  Temp(Src) 99.2 F (37.3 C)  Ht 5' 1.1" (1.552 m)  Wt 194 lb (87.998 kg)  BMI 36.53 kg/m2  Wt Readings from Last 3 Encounters:  03/04/15 194 lb (87.998 kg)  12/02/14 196 lb (88.905 kg)  12/18/14 196 lb (88.905 kg)    Physical Exam  Constitutional: She is oriented to person, place, and time. She appears well-developed and well-nourished. No distress.  HENT:  Head: Normocephalic and atraumatic.  Right Ear: Hearing normal.  Left Ear: Hearing normal.  Nose: Nose normal.  Eyes: Conjunctivae and lids are normal. Right eye exhibits no discharge. Left eye exhibits no discharge. No scleral icterus.   Cardiovascular: Normal rate, regular rhythm and normal heart sounds.   Pulmonary/Chest: Effort normal and breath sounds normal. No respiratory distress.  Musculoskeletal: Normal range of motion.  Neurological: She is alert and oriented to person, place, and time.  Skin: Skin is intact. No rash noted.  Psychiatric: She has a normal mood and affect. Her speech is normal and behavior is normal. Judgment and thought content normal. Cognition and memory are normal.    Results for orders placed or performed during the hospital encounter of 12/18/14  Glucose, capillary  Result Value Ref Range   Glucose-Capillary 113 (H) 65 - 99 mg/dL   Comment 1 Notify RN   Glucose, capillary  Result Value Ref Range   Glucose-Capillary 108 (H) 65 - 99 mg/dL  Surgical pathology  Result Value Ref Range   SURGICAL PATHOLOGY      Surgical Pathology CASE: [id number] PATIENT: Beatriz Stallion Surgical Pathology Report     SPECIMEN SUBMITTED: A. Submandibular gland, right B. Sublingual gland, right side  CLINICAL HISTORY: None provided  PRE-OPERATIVE DIAGNOSIS: Right submandibular gland prolapse/swelling  POST-OPERATIVE DIAGNOSIS: Same     DIAGNOSIS: A. SUBMANDIBULAR GLAND, RIGHT; EXCISION: - SALIVARY GLAND WITH TWO FOCI OF CHRONIC INFLAMMATION. - NEGATIVE FOR MALIGNANCY.  B. SUBLINGUAL GLAND, RIGHT; EXCISION: - SALIVARY GLAND WITH FOCAL CHRONIC INFLAMMATION. - NEGATIVE FOR MALIGNANCY.  Note A neoplastic process is not appreciated in this specimen. An adequate amount of intralobular adipose tissue is present for the patient's age.  GROSS DESCRIPTION:  A. Labeled: right submandibular gland Tissue Fragment(s): 1 Measurement: 7 g, 5.5 x 2.2 x 2.3 cm Comment: The specimen is a multilobulated unoriented pink-tan fragment of tissue with no grossly identified nerve or vessel atta ched, inked blue, section, lobulated pink-tan one area (2.0 x 1.5 x 1.4 cm) that is also lobulated however is  slightly more firm which abuts blue inked margin.  Representative submitted in cassette(s): 1-5-representative sections from one edge toward the opposite edge respectively with the more firm area entirely submitted in cassettes 3 and 4  B. Labeled: right sublingual gland Tissue Fragment(s): 1 Measurement: 4 g, 2.5 x 1.8 x 1.5 cm Comment: The specimen is a multilobulated unoriented fragment of pink-tan tissue, inked blue, sectioned, multilobulated with a warm rubbery tan 1.2 x 1.0 x 0.9 cm nodule abutting the blue inked margin.  Entirely submitted in cassette(s):  1-3 with nodule in block 1   Final Diagnosis performed by Delorse Lek, MD.  Electronically signed 12/19/2014 12:49:53PM    The electronic signature indicates that the named Attending Pathologist has evaluated the specimen  Technical component performed at [facility], [address], [place],  [place] [address] Lab: [phone] Dir: Darrick Penna. Evette Doffing, MD  Professional component performed at [facility], [facility], [place], [place], [place] [address] Lab: [phone] Dir: Dellia Nims. Reuel Derby, MD        Assessment & Plan:   Problem List Items Addressed This Visit      Cardiovascular and Mediastinum   Hypertension    The current medical regimen is effective;  continue present plan and medications.         Endocrine   Diabetes mellitus without complication    The current medical regimen is effective;  continue present plan and medications.       Relevant Orders   Bayer DCA Hb A1c Waived   Bayer DCA Hb A1c Waived     Other   Hyperlipidemia    The current medical regimen is effective;  continue present plan and medications.       Adult BMI 36.0-36.9 kg/sq m    Stress with patient difficulty with weight loss and surgery on her parotid and submandibular gland Patient will double down on weight loss with goal of 10% body weight by next year.      Incisional hernia    Patient with  incisional hernia repaired 2 previous times now getting worse and bothersome. This is ventral just under umbilicus. Refer to  surgical Dr. Tollie Pizza.      Relevant Orders   Ambulatory referral to General Surgery    Other Visit Diagnoses    Osteopenia    -  Primary    Relevant Orders    Vit D  25 hydroxy (rtn osteoporosis monitoring)    Essential hypertension, benign        Relevant Orders    Basic metabolic panel    Mixed hyperlipidemia        Relevant Orders    LP+ALT+AST Piccolo, Waived        Follow up plan: Return in about 3 months (around 06/03/2015), or if symptoms worsen or fail to improve, for Med check.

## 2015-03-04 NOTE — Assessment & Plan Note (Signed)
Patient with incisional hernia repaired 2 previous times now getting worse and bothersome. This is ventral just under umbilicus. Refer to  surgical Dr. Tollie Pizza.

## 2015-03-10 ENCOUNTER — Encounter: Payer: Self-pay | Admitting: General Surgery

## 2015-03-10 ENCOUNTER — Ambulatory Visit (INDEPENDENT_AMBULATORY_CARE_PROVIDER_SITE_OTHER): Payer: 59 | Admitting: General Surgery

## 2015-03-10 VITALS — BP 104/62 | HR 78 | Resp 14 | Ht 61.5 in | Wt 194.4 lb

## 2015-03-10 DIAGNOSIS — K432 Incisional hernia without obstruction or gangrene: Secondary | ICD-10-CM

## 2015-03-10 NOTE — Patient Instructions (Addendum)
The patient is aware to call back for any questions or concerns.    Abdominal CT scan  Advised to quit smoking for one month prior to surgery.  Patient is scheduled for a CT abdomen with contrast at [facility] for 02/13/15 at 8:30 am. She will arrive by 8:15 am and have clear liquids for 4 hours prior. She will pick up a prep kit today. Patient is aware of date, time, and instructions.

## 2015-03-10 NOTE — Progress Notes (Signed)
Patient ID: Susan Pineda, female   DOB: 1956/04/21, 59 y.o.   MRN: [id number]  Chief Complaint  Patient presents with  . Hernia    HPI Susan Pineda is a 59 y.o. female.  She is here for evaluation of possible abdominal hernia. She states she has occasional pinching sensation when she sits down. Occasional when she lifts something heavy she will notice it.  Bowels area regular and daily. She does admit to placing a towel in her abdominal fold to help her bowels to move. 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 She underwent repair of a ventral hernia in 2012 and again in 2013. Attempts to review these operative reports and the [facility] were unsuccessful.  HPI  Past Medical History  Diagnosis Date  . Hypertension   . Diabetes mellitus without complication   . Hyperlipidemia   . Hypothyroidism   . Chronic kidney disease     KIDNEY STONES  . Cancer 2012    THYROID  . Diverticulitis     Past Surgical History  Procedure Laterality Date  . Thyroidectomy    . Abdominal hysterectomy    . Lithotripsy      X2  . 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 Hernia repair    . Parathyroidectomy Right   . Submandibular gland excision Right 12/18/2014    Procedure: Right submandibular gland excision;  Surgeon: Carloyn Manner, MD;  Location: [facility] ORS;  Service: ENT;  Laterality: Right;  . Hernia repair  2012, 2013    Dr Rochel Brome    Family History  Problem Relation Age of Onset  . Cancer Mother   . Cancer Father     Social History Social History  Substance Use Topics  . Smoking status: Current Every Day Smoker -- 0.25 packs/day for 42 years    Types: Cigarettes  . Smokeless tobacco: Never Used  . Alcohol Use: No    No Known Allergies  Current Outpatient Prescriptions  Medication Sig Dispense Refill  . aspirin EC 81 MG tablet Take 81 mg by mouth daily.     . Cinnamon 500 MG TABS Take 1 tablet by mouth daily.    . Cranberry 1000 MG CAPS Take 1 capsule by mouth daily.    . INVOKANA 300 MG TABS tablet Take 1 tablet by mouth  daily 90 tablet 1  . levothyroxine (SYNTHROID, LEVOTHROID) 100 MCG tablet Take 100 mcg by mouth daily before breakfast.    . lisinopril (PRINIVIL,ZESTRIL) 5 MG tablet Take 5 mg by mouth every morning.    . metFORMIN (GLUCOPHAGE) 500 MG tablet Take 1 tablet by mouth  twice a day 180 tablet 6  . pravastatin (PRAVACHOL) 40 MG tablet Take 40 mg by mouth at bedtime.    . Probiotic Product (PROBIOTIC DAILY PO) Take 1 capsule by mouth daily.     No current facility-administered medications for this visit.    Review of Systems Review of Systems  Constitutional: Negative.   Respiratory: Negative.   Cardiovascular: Negative.   Gastrointestinal: Positive for abdominal pain. Negative for nausea, vomiting, diarrhea and constipation.    Blood pressure 104/62, pulse 78, resp. rate 14, height 5' 1.5" (1.562 m), weight 194 lb 6.4 oz (88.179 kg), SpO2 98 %.  Physical Exam Physical Exam  Constitutional: She is oriented to person, place, and time. She appears well-developed and well-nourished.  HENT:  Mouth/Throat: Oropharynx  is clear and moist.  Eyes: Conjunctivae are normal. No scleral icterus.  Neck: Neck supple.  Cardiovascular: Normal rate and regular rhythm.   Murmur heard.  Systolic murmur is present with a grade of 2/6  Pulses:      Dorsalis pedis pulses are 2+ on the right side, and 2+ on the left side.  Pulmonary/Chest: Effort normal and breath sounds normal.  Abdominal: Soft. Normal appearance and bowel sounds are normal. There is no tenderness.    6 cm defect below umbilicus.   Lymphadenopathy:    She has no cervical adenopathy.  Neurological: She is alert and oriented to person, place, and time.  Skin: Skin is warm and dry.  Psychiatric: Her behavior is normal.    Data Reviewed Old records not  available.  Assessment    Recurrent ventral hernia.    Plan    A CT to evaluate for other possible hernias and to establish the location of the rectus muscles will be important in surgical planning. She may be a candidate for laparoscopic repair or may require component separation.  Importance of discontinuing smoking completely (she admits to 5 cigarettes/day at this time) to improve her chance for safe healing, minimize postoperative pulmonary complications and coughing and to give her her best chance for a third time hernia repair was reviewed in detail.    Abdominal CT scan with contrast. Advised to quit smoking for one month prior to surgery.  Patient is scheduled for a CT abdomen with contrast at [facility] for 02/13/15 at 8:30 am. She will arrive by 8:15 am and have clear liquids for 4 hours prior. She will pick up a prep kit today. Patient is aware of date, time, and instructions.   PCP:  Kennieth Francois 03/11/2015, 4:29 PM

## 2015-03-11 DIAGNOSIS — K432 Incisional hernia without obstruction or gangrene: Secondary | ICD-10-CM | POA: Insufficient documentation

## 2015-03-16 ENCOUNTER — Telehealth: Payer: Self-pay | Admitting: Family Medicine

## 2015-03-16 ENCOUNTER — Other Ambulatory Visit: Payer: Self-pay

## 2015-03-16 ENCOUNTER — Ambulatory Visit
Admission: RE | Admit: 2015-03-16 | Discharge: 2015-03-16 | Disposition: A | Discharge status: Home / Self Care | Payer: 59 | Source: Ambulatory Visit | Attending: General Surgery | Admitting: General Surgery

## 2015-03-16 DIAGNOSIS — K439 Ventral hernia without obstruction or gangrene: Secondary | ICD-10-CM

## 2015-03-16 DIAGNOSIS — K432 Incisional hernia without obstruction or gangrene: Secondary | ICD-10-CM

## 2015-03-16 DIAGNOSIS — K760 Fatty (change of) liver, not elsewhere classified: Secondary | ICD-10-CM | POA: Insufficient documentation

## 2015-03-16 MED ORDER — IOHEXOL 300 MG/ML  SOLN
100.0000 mL | Freq: Once | INTRAMUSCULAR | Status: AC | PRN
  Administered 2015-03-16: 100 mL via INTRAVENOUS

## 2015-03-16 NOTE — Telephone Encounter (Signed)
erroneous

## 2015-03-17 ENCOUNTER — Telehealth: Payer: Self-pay | Admitting: *Deleted

## 2015-03-17 NOTE — Telephone Encounter (Signed)
-----   Message from Robert Bellow, MD sent at 03/16/2015  2:35 PM EDT ----- Please notify the patient the CT scan shows 1 hernia. Good news. We'll get together prior to surgery to review options. ----- Message -----    From: Rad Results In Interface    Sent: 03/16/2015   9:29 AM      To: Robert Bellow, MD

## 2015-03-17 NOTE — Telephone Encounter (Signed)
Notified patient as instructed, patient pleased. Discussed follow-up appointments, patient agrees  

## 2015-03-20 ENCOUNTER — Encounter: Payer: Self-pay | Admitting: General Surgery

## 2015-03-20 NOTE — Progress Notes (Signed)
Record review was completed. The patient underwent sigmoid resection and takedown of a colovaginal fistula under the care of Sabra Heck, M.D. on 02/20/2010.  First ventral hernia was identified in 2012 and she underwent retrorectus placement of Gore-Tex Bio A mesh, 9 x 13 cm on 02/22/2011.  Second ventral hernia repair with special notation that the patient declined permanent mesh was undertaken on 03/21/2012. A recurrent hernia was identified at the lower aspect of the incision, again reportedly being tucked behind the pubic tubercle.

## 2015-04-08 ENCOUNTER — Telehealth: Payer: Self-pay | Admitting: General Surgery

## 2015-04-08 NOTE — Telephone Encounter (Signed)
THE PT CALLED TODAY TO ADVISE YOU WOULD BE RECEIVING DISABILITY PAPERS FOR HER SX SCH'D 06-02-15. THIS WAS THE INFO SHE WAS GIVEN.SHE'S NOT SURE IF THEY ARE GOING TO SEND THEM SOON OR CLOSER TO SX DATE.HER NEXT APPT(PRE-OP) HERE IS 05-20-15.

## 2015-04-09 ENCOUNTER — Telehealth: Payer: Self-pay

## 2015-04-09 MED ORDER — ACCU-CHEK MULTICLIX LANCETS MISC: Status: DC

## 2015-04-09 MED ORDER — GLUCOSE BLOOD VI STRP: ORAL_STRIP | Status: DC

## 2015-04-09 NOTE — Telephone Encounter (Signed)
Freestyle Lancet #100 T/SFreestyle Lite Strips #100   Don't forger  Tests per day and Dx CODE  [facility]

## 2015-05-20 ENCOUNTER — Encounter: Payer: Self-pay | Admitting: General Surgery

## 2015-05-20 ENCOUNTER — Ambulatory Visit (INDEPENDENT_AMBULATORY_CARE_PROVIDER_SITE_OTHER): Payer: 59 | Admitting: General Surgery

## 2015-05-20 VITALS — BP 114/60 | HR 68 | Resp 13 | Ht 61.5 in | Wt 193.8 lb

## 2015-05-20 DIAGNOSIS — K432 Incisional hernia without obstruction or gangrene: Secondary | ICD-10-CM

## 2015-05-20 NOTE — Progress Notes (Signed)
Patient ID: Susan Pineda, female   DOB: 1956/05/24, 59 y.o.   MRN: [id number]  Chief Complaint  Patient presents with  . Pre-op Exam    hernia surgery    HPI Susan Pineda is a 59 y.o. female here for a pre op exam for a ventral hernia surgery scheduled for 06/02/15. She reports that the area seems to be the same. She reports that she has cut back on her smoking. She reports no problems with eating or using the restroom.  HPI  Past Medical History  Diagnosis Date  . Hypertension   . Diabetes mellitus without complication ([place])   . Hyperlipidemia   . Hypothyroidism   . Chronic kidney disease     KIDNEY STONES  . Diverticulitis   . Cancer [facility]) 2012    THYROID    Past Surgical History  Procedure Laterality Date  . Thyroidectomy    . Abdominal hysterectomy    . Lithotripsy      X2  . 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 Hernia repair    . Parathyroidectomy Right   . Submandibular gland excision Right 12/18/2014    Procedure: Right submandibular gland and subligual gland excision;  Surgeon: Carloyn Manner, MD;  Location: [facility] ORS;  Service: ENT;  Laterality: Right;  . Hernia repair  2012, 2013    Dr Rochel Brome    Family History  Problem Relation Age of Onset  . Cancer Mother   . Cancer Father     Social History Social History  Substance Use Topics  . Smoking status: Current Some Day Smoker -- 0.25 packs/day for 42 years    Types: Cigarettes  . Smokeless tobacco: Never Used  . Alcohol Use: No    No Known Allergies  Current Outpatient Prescriptions  Medication Sig Dispense Refill  . aspirin EC 81 MG tablet Take 81 mg by mouth daily.    . Cinnamon 500 MG TABS Take 1 tablet by mouth daily.    . Cranberry 1000 MG CAPS Take 1 capsule by mouth daily.    Marland Kitchen glucose blood (FREESTYLE LITE) test strip 1 a day dx W11.9 100 each 12  . INVOKANA 300 MG TABS tablet Take 1 tablet by mouth  daily 90 tablet 1  . Lancets (ACCU-CHEK MULTICLIX) lancets  Use as instructed 100 each 12  . levothyroxine (SYNTHROID) 100 MCG tablet Take 100 mcg by mouth daily before breakfast.    . lisinopril (PRINIVIL,ZESTRIL) 5 MG tablet Take 5 mg by mouth every morning.    . metFORMIN (GLUCOPHAGE) 500 MG tablet Take 1 tablet by mouth  twice a day 180 tablet 6  . pravastatin (PRAVACHOL) 40 MG tablet Take 40 mg by mouth at bedtime.    . Probiotic Product (PROBIOTIC DAILY PO) Take 1 capsule by mouth daily.     No current facility-administered medications for this visit.    Review of Systems Review of Systems  Constitutional: Negative.   Respiratory: Negative.   Cardiovascular: Negative.   Gastrointestinal: Negative.     Blood pressure 114/60, pulse 68, resp. rate 13, height 5' 1.5" (1.562 m), weight 193 lb 12.8 oz (87.907 kg).  Physical Exam Physical Exam  Constitutional: She is oriented to person, place, and time. She appears well-developed and well-nourished.  Eyes: Conjunctivae are normal. No scleral icterus.  Neck: Neck supple.  Cardiovascular: Normal rate, regular rhythm and normal heart sounds.   Pulmonary/Chest: Effort normal and breath sounds normal.  Abdominal: Soft. Normal appearance.  There is no tenderness. A hernia is present. Hernia confirmed positive in the ventral area (lower abdominal wall hernia).    Lymphadenopathy:    She has no cervical adenopathy.  Neurological: She is alert and oriented to person, place, and time.  Skin: Skin is warm and dry.  Psychiatric: She has a normal mood and affect.    Data Reviewed 03/16/2015 CT of the abdomen and pelvis:   IMPRESSION: Periumbilical level ventral hernia containing fat and bowel but no bowel compromise. More inferiorly, there is midline rectus muscle thinning without frank hernia.    Assessment    Periumbilical hernia, fascial thinning inferiorly.    Plan    The patient had had 2 previous open explorations with use of absorbable mesh. Her clinical exam at present is more  suggestive of lower defect rather than upper defect. This will best be assessed at laparoscopy. The patient is amenable to prosthetic mesh placement at this time, and is aware of the risks associated with prosthetic mesh.  She is made a good effort at smoking cessation, reporting a rare cigarette, not on a daily basis. This is likely as good as we'll get.      Hernia precautions and incarceration were discussed with the patient. If they develop symptoms of an incarcerated hernia, they were encouraged to seek prompt medical attention. I have recommended repair of the hernia using mesh on an outpatient basis in the near future. The risk of infection was reviewed. The role of prosthetic mesh to minimize the risk of recurrence was reviewed.  Patient's surgery has been scheduled for 06-02-15 at [facility]. It is okay for patient to continue 81 mg aspirin once daily.   PCP: Phoebe Perch 05/22/2015, 12:12 PM

## 2015-05-20 NOTE — Patient Instructions (Addendum)
Laparoscopic Ventral Hernia Repair °Laparoscopic ventral hernia repair is a surgery to fix a ventral hernia. A ventral hernia, also called an incisional hernia, is a bulge of body tissue or intestines that pushes through the front part of the abdomen. This can happen if the connective tissue covering the muscles over the abdomen has a weak spot or is torn because of a surgical cut (incision) from a previous surgery. Laparoscopic ventral hernia repair is often done soon after diagnosis to stop the hernia from getting bigger, becoming uncomfortable, or becoming an emergency. This surgery usually takes about 2 hours, but the time can vary greatly. °LET YOUR HEALTH CARE PROVIDER KNOW ABOUT: °· Any allergies you have. °· All medicines you are taking, including steroids, vitamins, herbs, eye drops, creams, and over-the-counter medicines. °· Previous problems you or members of your family have had with the use of anesthetics. °· Any blood disorders you have. °· Previous surgeries you have had. °· Medical conditions you have. °RISKS AND COMPLICATIONS  °Generally, laparoscopic ventral hernia repair is a safe procedure. However, as with any surgical procedure, problems can occur. Possible problems include: °· Bleeding. °· Trouble passing urine or having a bowel movement after the surgery. °· Infection. °· Pneumonia. °· Blood clots. °· Pain in the area of the hernia. °· A bulge in the area of the hernia that may be caused by a collection of fluid. °· Injury to intestines or other structures in the abdomen. °· Return of the hernia after surgery. °In some cases, your health care provider may need to stop the laparoscopic procedure and do regular, open surgery. This may be necessary for very difficult hernias, when organs are hard to see, or when bleeding problems occur during surgery. °BEFORE THE PROCEDURE  °· You may need to have blood tests, urine tests, a chest X-ray, or an electrocardiogram done before the day of the  surgery. °· Ask your health care provider about changing or stopping your regular medicines. This is especially important if you are taking diabetes medicines or blood thinners. °· You may need to wash with a special type of germ-killing soap. °· Do not eat or drink anything after midnight the night before the procedure or as directed by your health care provider. °· Make plans to have someone drive you home after the procedure. °PROCEDURE  °· Small monitors will be put on your body. They are used to check your heart, blood pressure, and oxygen level. °· An IV access tube will be put into a vein in your hand or arm. Fluids and medicine will flow directly into your body through the IV tube. °· You will be given medicine that makes you go to sleep (general anesthetic). °· Your abdomen will be cleaned with a special soap to kill any germs on your skin. °· Once you are asleep, several small incisions will be made in your abdomen. °· The large space in your abdomen will be filled with air so that it expands. This gives your health care provider more room and a better view. °· A thin, lighted tube with a tiny camera on the end (laparoscope) is put through a small incision in your abdomen. The camera on the laparoscope sends a picture to a TV screen in the operating room. This gives your health care provider a good view inside your abdomen. °· Hollow tubes are put through the other small incisions in your abdomen. The tools needed for the procedure are put through these tubes. °· Your health care provider   puts the tissue or intestines that formed the hernia back in place.  A screen-like patch (mesh) is used to close the hernia. This helps make the area stronger. Stitches, tacks, or staples are used to keep the mesh in place.  Medicine and a bandage (dressing) or skin glue will be put over the incisions. AFTER THE PROCEDURE   You will stay in a recovery area until the anesthetic wears off. Your blood pressure and  pulse will be checked often.  You may be able to go home the same day or may need to stay in the hospital for 1-2 days after surgery. Your health care provider will decide when you can go home.  You may feel some pain. You may be given medicine for pain.  You will be urged to do breathing exercises that involve taking deep breaths. This helps prevent a lung infection after a surgery.  You may have to wear compression stockings while you are in the hospital. These stockings help keep blood clots from forming in your legs.   This information is not intended to replace advice given to you by your health care provider. Make sure you discuss any questions you have with your health care provider.   Document Released: 06/06/2012 Document Revised: 06/25/2013 Document Reviewed: 06/06/2012 Elsevier Interactive Patient Education 2016 [employer].  Patient's surgery has been scheduled for 06-02-15 at [facility].

## 2015-05-22 NOTE — H&P (Signed)
HPI  Susan Pineda is a 59 y.o. female here for a pre op exam for a ventral hernia surgery scheduled for 06/02/15. She reports that the area seems to be the same. She reports that she has cut back on her smoking. She reports no problems with eating or using the restroom.  HPI  Past Medical History   Diagnosis  Date   .  Hypertension    .  Diabetes mellitus without complication ([place])    .  Hyperlipidemia    .  Hypothyroidism    .  Chronic kidney disease      KIDNEY STONES   .  Diverticulitis    .  Cancer [facility])  2012     THYROID    Past Surgical History   Procedure  Laterality  Date   .  Thyroidectomy     .  Abdominal hysterectomy     .  Lithotripsy       X2   .  Sigmoidectomy   2011     colovaginal fistula/diverticulitis   .  Hernia repair     .  Parathyroidectomy  Right    .  Submandibular gland excision  Right  12/18/2014     Procedure: Right submandibular gland and subligual gland excision; Surgeon: Carloyn Manner, MD; Location: [facility] ORS; Service: ENT; Laterality: Right;   .  Hernia repair   2012, 2013     Dr Rochel Brome    Family History   Problem  Relation  Age of Onset   .  Cancer  Mother    .  Cancer  Father     Social History  Social History   Substance Use Topics   .  Smoking status:  Current Some Day Smoker -- 0.25 packs/day for 42 years     Types:  Cigarettes   .  Smokeless tobacco:  Never Used   .  Alcohol Use:  No    No Known Allergies  Current Outpatient Prescriptions   Medication  Sig  Dispense  Refill   .  aspirin EC 81 MG tablet  Take 81 mg by mouth daily.     .  Cinnamon 500 MG TABS  Take 1 tablet by mouth daily.     .  Cranberry 1000 MG CAPS  Take 1 capsule by mouth daily.     Marland Kitchen  glucose blood (FREESTYLE LITE) test strip  1 a day dx W11.9  100 each  12   .  INVOKANA 300 MG TABS tablet  Take 1 tablet by mouth daily  90 tablet  1   .  Lancets (ACCU-CHEK MULTICLIX) lancets  Use as instructed  100 each  12   .  levothyroxine (SYNTHROID) 100 MCG  tablet  Take 100 mcg by mouth daily before breakfast.     .  lisinopril (PRINIVIL,ZESTRIL) 5 MG tablet  Take 5 mg by mouth every morning.     .  metFORMIN (GLUCOPHAGE) 500 MG tablet  Take 1 tablet by mouth twice a day  180 tablet  6   .  pravastatin (PRAVACHOL) 40 MG tablet  Take 40 mg by mouth at bedtime.     .  Probiotic Product (PROBIOTIC DAILY PO)  Take 1 capsule by mouth daily.      No current facility-administered medications for this visit.    Review of Systems  Review of Systems  Constitutional: Negative.  Respiratory: Negative.  Cardiovascular: Negative.  Gastrointestinal: Negative.   Blood pressure 114/60, pulse 68,  resp. rate 13, height 5' 1.5" (1.562 m), weight 193 lb 12.8 oz (87.907 kg).  Physical Exam  Physical Exam  Constitutional: She is oriented to person, place, and time. She appears well-developed and well-nourished.  Eyes: Conjunctivae are normal. No scleral icterus.  Neck: Neck supple.  Cardiovascular: Normal rate, regular rhythm and normal heart sounds.  Pulmonary/Chest: Effort normal and breath sounds normal.  Abdominal: Soft. Normal appearance. There is no tenderness. A hernia is present. Hernia confirmed positive in the ventral area (lower abdominal wall hernia).    Lymphadenopathy:  She has no cervical adenopathy.  Neurological: She is alert and oriented to person, place, and time.  Skin: Skin is warm and dry.  Psychiatric: She has a normal mood and affect.   Data Reviewed  03/16/2015 CT of the abdomen and pelvis:  IMPRESSION:  Periumbilical level ventral hernia containing fat and bowel but no  bowel compromise. More inferiorly, there is midline rectus muscle  thinning without frank hernia.  Assessment   Periumbilical hernia, fascial thinning inferiorly.   Plan   The patient had had 2 previous open explorations with use of absorbable mesh. Her clinical exam at present is more suggestive of lower defect rather than upper defect. This will best be  assessed at laparoscopy. The patient is amenable to prosthetic mesh placement at this time, and is aware of the risks associated with prosthetic mesh.  She is made a good effort at smoking cessation, reporting a rare cigarette, not on a daily basis. This is likely as good as we'll get.   Hernia precautions and incarceration were discussed with the patient. If they develop symptoms of an incarcerated hernia, they were encouraged to seek prompt medical attention.  I have recommended repair of the hernia using mesh on an outpatient basis in the near future. The risk of infection was reviewed. The role of prosthetic mesh to minimize the risk of recurrence was reviewed.  Patient's surgery has been scheduled for 06-02-15 at [facility]. It is okay for patient to continue 81 mg aspirin once daily.  PCP: Phoebe Perch  05/22/2015, 12:12 PM

## 2015-05-26 ENCOUNTER — Inpatient Hospital Stay: Admission: RE | Admit: 2015-05-26 | Payer: 59 | Source: Ambulatory Visit

## 2015-05-26 ENCOUNTER — Encounter: Payer: Self-pay | Admitting: *Deleted

## 2015-05-26 NOTE — Patient Instructions (Signed)
  Your procedure is scheduled on: 06-02-15 (TUESDAY) Report to [place] To find out your arrival time please call [phone] between 1PM - 3PM on 06-01-15 [facility])  Remember: Instructions that are not followed completely may result in serious medical risk, up to and including death, or upon the discretion of your surgeon and anesthesiologist your surgery may need to be rescheduled.    _X__ 1. Do not eat food or drink liquids after midnight. No gum chewing or hard candies.     _X__ 2. No Alcohol for 24 hours before or after surgery.   ____ 3. Bring all medications with you on the day of surgery if instructed.    _X__ 4. Notify your doctor if there is any change in your medical condition     (cold, fever, infections).     Do not wear jewelry, make-up, hairpins, clips or nail polish.  Do not wear lotions, powders, or perfumes. You may wear deodorant.  Do not shave 48 hours prior to surgery. Men may shave face and neck.  Do not bring valuables to the hospital.    [facility] is not responsible for any belongings or valuables.               Contacts, dentures or bridgework may not be worn into surgery.  Leave your suitcase in the car. After surgery it may be brought to your room.  For patients admitted to the hospital, discharge time is determined by your treatment team.   Patients discharged the day of surgery will not be allowed to drive home.   Please read over the following fact sheets that you were given:      _X__ Take these medicines the morning of surgery with A SIP OF WATER:    1. SYNTHROID  2. LISINOPRIL  3. PEPCID  4. TAKE A PEPCID Monday NIGHT  5.  6.  ____ Fleet Enema (as directed)   _X__ Use CHG Soap as directed  ____ Use inhalers on the day of surgery  _X__ Stop metformin 2 days prior to surgery-LAST DOSE ON 05-30-15 (SATURDAY)  ____ Take 1/2 of usual insulin dose the night before surgery and none on the morning of surgery.    ____ Stop Coumadin/Plavix/aspirin-OK TO CONTINUE ASPIRIN PER DR BYRNETT-DO NOT TAKE THE MORNING OF SURGERY  ____ Stop Anti-inflammatories   _X__ Stop supplements until after surgery-STOP CRANBERRY, CINNAMON, PROBIOTIC NOW  ____ Bring C-Pap to the hospital.

## 2015-05-27 ENCOUNTER — Encounter
Admission: RE | Admit: 2015-05-27 | Discharge: 2015-05-27 | Disposition: A | Discharge status: Home / Self Care | Payer: 59 | Source: Ambulatory Visit | Attending: Anesthesiology | Admitting: Anesthesiology

## 2015-05-27 DIAGNOSIS — Z01812 Encounter for preprocedural laboratory examination: Secondary | ICD-10-CM | POA: Diagnosis present

## 2015-05-27 LAB — BASIC METABOLIC PANEL
ANION GAP: 8 (ref 5–15)
BUN: 16 mg/dL (ref 6–20)
CO2: 26 mmol/L (ref 22–32)
Calcium: 8.9 mg/dL (ref 8.9–10.3)
Chloride: 104 mmol/L (ref 101–111)
Creatinine, Ser: 0.7 mg/dL (ref 0.44–1.00)
GFR calc Af Amer: >60 mL/min (ref >60)
Glucose, Bld: 70 mg/dL (ref 65–99)
POTASSIUM: 4.2 mmol/L (ref 3.5–5.1)
Sodium: 138 mmol/L (ref 135–145)

## 2015-06-02 ENCOUNTER — Encounter
Admission: RE | Disposition: A | Discharge status: Home / Self Care | Payer: Self-pay | Source: Ambulatory Visit | Attending: General Surgery

## 2015-06-02 ENCOUNTER — Ambulatory Visit: Payer: 59 | Admitting: Anesthesiology

## 2015-06-02 ENCOUNTER — Encounter: Payer: Self-pay | Admitting: *Deleted

## 2015-06-02 ENCOUNTER — Ambulatory Visit
Admission: RE | Admit: 2015-06-02 | Discharge: 2015-06-02 | Disposition: A | Discharge status: Home / Self Care | Payer: 59 | Source: Ambulatory Visit | Attending: General Surgery | Admitting: General Surgery

## 2015-06-02 DIAGNOSIS — E119 Type 2 diabetes mellitus without complications: Secondary | ICD-10-CM | POA: Insufficient documentation

## 2015-06-02 DIAGNOSIS — N189 Chronic kidney disease, unspecified: Secondary | ICD-10-CM | POA: Diagnosis not present

## 2015-06-02 DIAGNOSIS — K439 Ventral hernia without obstruction or gangrene: Secondary | ICD-10-CM | POA: Diagnosis present

## 2015-06-02 DIAGNOSIS — Z79899 Other long term (current) drug therapy: Secondary | ICD-10-CM | POA: Diagnosis not present

## 2015-06-02 DIAGNOSIS — Z85850 Personal history of malignant neoplasm of thyroid: Secondary | ICD-10-CM | POA: Diagnosis not present

## 2015-06-02 DIAGNOSIS — E785 Hyperlipidemia, unspecified: Secondary | ICD-10-CM | POA: Insufficient documentation

## 2015-06-02 DIAGNOSIS — I129 Hypertensive chronic kidney disease with stage 1 through stage 4 chronic kidney disease, or unspecified chronic kidney disease: Secondary | ICD-10-CM | POA: Insufficient documentation

## 2015-06-02 DIAGNOSIS — Z7982 Long term (current) use of aspirin: Secondary | ICD-10-CM | POA: Insufficient documentation

## 2015-06-02 DIAGNOSIS — Z9889 Other specified postprocedural states: Secondary | ICD-10-CM | POA: Insufficient documentation

## 2015-06-02 DIAGNOSIS — F17210 Nicotine dependence, cigarettes, uncomplicated: Secondary | ICD-10-CM | POA: Insufficient documentation

## 2015-06-02 DIAGNOSIS — Z90710 Acquired absence of both cervix and uterus: Secondary | ICD-10-CM | POA: Insufficient documentation

## 2015-06-02 DIAGNOSIS — Z7984 Long term (current) use of oral hypoglycemic drugs: Secondary | ICD-10-CM | POA: Diagnosis not present

## 2015-06-02 DIAGNOSIS — K429 Umbilical hernia without obstruction or gangrene: Secondary | ICD-10-CM | POA: Insufficient documentation

## 2015-06-02 DIAGNOSIS — E890 Postprocedural hypothyroidism: Secondary | ICD-10-CM | POA: Insufficient documentation

## 2015-06-02 DIAGNOSIS — K432 Incisional hernia without obstruction or gangrene: Secondary | ICD-10-CM

## 2015-06-02 HISTORY — DX: Gastro-esophageal reflux disease without esophagitis: K21.9

## 2015-06-02 HISTORY — DX: Anemia, unspecified: D64.9

## 2015-06-02 HISTORY — PX: VENTRAL HERNIA REPAIR: SHX424

## 2015-06-02 LAB — GLUCOSE, CAPILLARY
Glucose-Capillary: 102 mg/dL — ABNORMAL HIGH (ref 65–99)
Glucose-Capillary: 111 mg/dL — ABNORMAL HIGH (ref 65–99)

## 2015-06-02 SURGERY — REPAIR, HERNIA, VENTRAL
Anesthesia: General | Wound class: Clean

## 2015-06-02 MED ORDER — FENTANYL CITRATE (PF) 100 MCG/2ML IJ SOLN
25.0000 ug | INTRAMUSCULAR | Status: DC | PRN
  Administered 2015-06-02 (×4): 25 ug via INTRAVENOUS

## 2015-06-02 MED ORDER — IPRATROPIUM-ALBUTEROL 0.5-2.5 (3) MG/3ML IN SOLN
RESPIRATORY_TRACT | Status: AC
Start: 2015-06-02 — End: 2015-06-02
  Administered 2015-06-02: 3 mL via RESPIRATORY_TRACT
  Filled 2015-06-02: qty 3

## 2015-06-02 MED ORDER — HYDROCODONE-ACETAMINOPHEN 5-325 MG PO TABS
1.0000 | ORAL_TABLET | ORAL | Status: DC | PRN
  Administered 2015-06-02: 1 via ORAL

## 2015-06-02 MED ORDER — MIDAZOLAM HCL 2 MG/2ML IJ SOLN
INTRAMUSCULAR | Status: DC | PRN
  Administered 2015-06-02: 1 mg via INTRAVENOUS

## 2015-06-02 MED ORDER — ROCURONIUM BROMIDE 100 MG/10ML IV SOLN
INTRAVENOUS | Status: DC | PRN
  Administered 2015-06-02: 40 mg via INTRAVENOUS
  Administered 2015-06-02: 10 mg via INTRAVENOUS

## 2015-06-02 MED ORDER — HYDROCODONE-ACETAMINOPHEN 5-325 MG PO TABS: 1.0000 | ORAL_TABLET | ORAL | Status: DC | PRN

## 2015-06-02 MED ORDER — OXYCODONE HCL 5 MG PO TABS: 5.0000 mg | ORAL_TABLET | Freq: Once | ORAL | Status: DC | PRN

## 2015-06-02 MED ORDER — CEFAZOLIN SODIUM-DEXTROSE 2-3 GM-% IV SOLR
2.0000 g | INTRAVENOUS | Status: AC
  Administered 2015-06-02: 2 g via INTRAVENOUS

## 2015-06-02 MED ORDER — BUPIVACAINE HCL (PF) 0.5 % IJ SOLN
INTRAMUSCULAR | Status: AC
  Filled 2015-06-02: qty 30

## 2015-06-02 MED ORDER — SUGAMMADEX SODIUM 200 MG/2ML IV SOLN
INTRAVENOUS | Status: DC | PRN
  Administered 2015-06-02: 175 mg via INTRAVENOUS

## 2015-06-02 MED ORDER — CEFAZOLIN SODIUM-DEXTROSE 2-3 GM-% IV SOLR
INTRAVENOUS | Status: AC
  Administered 2015-06-02: 2 g via INTRAVENOUS
  Filled 2015-06-02: qty 50

## 2015-06-02 MED ORDER — FENTANYL CITRATE (PF) 100 MCG/2ML IJ SOLN
INTRAMUSCULAR | Status: DC | PRN
  Administered 2015-06-02: 150 ug via INTRAVENOUS
  Administered 2015-06-02: 50 ug via INTRAVENOUS

## 2015-06-02 MED ORDER — SODIUM CHLORIDE 0.9 % IV SOLN
INTRAVENOUS | Status: DC
  Administered 2015-06-02: 07:00:00 via INTRAVENOUS

## 2015-06-02 MED ORDER — HYDROCODONE-ACETAMINOPHEN 5-325 MG PO TABS
ORAL_TABLET | ORAL | Status: AC
  Filled 2015-06-02: qty 1

## 2015-06-02 MED ORDER — OXYCODONE HCL 5 MG/5ML PO SOLN: 5.0000 mg | Freq: Once | ORAL | Status: DC | PRN

## 2015-06-02 MED ORDER — PROPOFOL 10 MG/ML IV BOLUS
INTRAVENOUS | Status: DC | PRN
  Administered 2015-06-02: 150 mg via INTRAVENOUS

## 2015-06-02 MED ORDER — FENTANYL CITRATE (PF) 100 MCG/2ML IJ SOLN
INTRAMUSCULAR | Status: AC
  Administered 2015-06-02: 25 ug via INTRAVENOUS
  Filled 2015-06-02: qty 2

## 2015-06-02 MED ORDER — ONDANSETRON HCL 4 MG/2ML IJ SOLN
INTRAMUSCULAR | Status: DC | PRN
  Administered 2015-06-02: 4 mg via INTRAVENOUS

## 2015-06-02 MED ORDER — KETOROLAC TROMETHAMINE 30 MG/ML IJ SOLN
INTRAMUSCULAR | Status: DC | PRN
  Administered 2015-06-02: 30 mg via INTRAVENOUS

## 2015-06-02 MED ORDER — ACETAMINOPHEN 10 MG/ML IV SOLN
INTRAVENOUS | Status: DC | PRN
  Administered 2015-06-02: 1000 mg via INTRAVENOUS

## 2015-06-02 MED ORDER — IPRATROPIUM-ALBUTEROL 0.5-2.5 (3) MG/3ML IN SOLN
3.0000 mL | Freq: Once | RESPIRATORY_TRACT | Status: AC
  Administered 2015-06-02: 3 mL via RESPIRATORY_TRACT

## 2015-06-02 SURGICAL SUPPLY — 46 items
BLADE SURG 15 STRL SS SAFETY (BLADE) ×6 IMPLANT
CANISTER SUCT 1200ML W/VALVE (MISCELLANEOUS) ×3 IMPLANT
CANNULA DILATOR  5MM W/SLV (CANNULA) ×1
CANNULA DILATOR 12 W/SLV (CANNULA) ×2 IMPLANT
CANNULA DILATOR 12MM W/SLV (CANNULA) ×1
CANNULA DILATOR 5 W/SLV (CANNULA) ×2 IMPLANT
CATH TRAY 16F METER LATEX (MISCELLANEOUS) ×3 IMPLANT
CHLORAPREP W/TINT 26ML (MISCELLANEOUS) ×3 IMPLANT
CLOSURE WOUND 1/2 X4 (GAUZE/BANDAGES/DRESSINGS)
DEVICE SECURE STRAP 25 ABSORB (INSTRUMENTS) ×6 IMPLANT
DRAIN CHANNEL JP 15F RND 16 (MISCELLANEOUS) IMPLANT
DRAPE CHEST BREAST 77X106 FENE (MISCELLANEOUS) IMPLANT
DRAPE LAPAROTOMY 100X77 ABD (DRAPES) ×3 IMPLANT
DRSG TEGADERM 4X4.75 (GAUZE/BANDAGES/DRESSINGS) ×6 IMPLANT
DRSG TELFA 3X8 NADH (GAUZE/BANDAGES/DRESSINGS) ×3 IMPLANT
GAUZE SPONGE 4X4 12PLY STRL (GAUZE/BANDAGES/DRESSINGS) IMPLANT
GLOVE BIO SURGEON STRL SZ7.5 (GLOVE) ×9 IMPLANT
GLOVE INDICATOR 8.0 STRL GRN (GLOVE) ×3 IMPLANT
GOWN STRL REUS W/ TWL LRG LVL3 (GOWN DISPOSABLE) ×2 IMPLANT
GOWN STRL REUS W/TWL LRG LVL3 (GOWN DISPOSABLE) ×4
GRASPER SUT 15 45D LOW PRO (SUTURE) ×3 IMPLANT
KIT RM TURNOVER STRD PROC AR (KITS) ×3 IMPLANT
LABEL OR SOLS (LABEL) IMPLANT
MESH VENT LT ST 10.2X15.2CM EL (Mesh General) ×3 IMPLANT
NDL SAFETY 22GX1.5 (NEEDLE) ×3 IMPLANT
NEEDLE HYPO 25X1 1.5 SAFETY (NEEDLE) ×3 IMPLANT
NS IRRIG 500ML POUR BTL (IV SOLUTION) ×3 IMPLANT
PACK BASIN MINOR ARMC (MISCELLANEOUS) ×3 IMPLANT
PAD ABD DERMACEA PRESS 5X9 (GAUZE/BANDAGES/DRESSINGS) ×3 IMPLANT
PAD GROUND ADULT SPLIT (MISCELLANEOUS) ×3 IMPLANT
PENCIL ELECTRO HAND CTR (MISCELLANEOUS) ×3 IMPLANT
SCISSORS METZENBAUM CVD 33 (INSTRUMENTS) ×3 IMPLANT
SPONGE LAP 18X18 5 PK (GAUZE/BANDAGES/DRESSINGS) ×3 IMPLANT
STAPLER SKIN PROX 35W (STAPLE) IMPLANT
STRIP CLOSURE SKIN 1/2X4 (GAUZE/BANDAGES/DRESSINGS) IMPLANT
SUT MAXON ABS #0 GS21 30IN (SUTURE) ×6 IMPLANT
SUT SURGILON 0 BLK (SUTURE) IMPLANT
SUT VIC AB 2-0 BRD 54 (SUTURE) IMPLANT
SUT VIC AB 3-0 SH 27 (SUTURE) ×2
SUT VIC AB 3-0 SH 27X BRD (SUTURE) ×1 IMPLANT
SUT VIC AB 4-0 FS2 27 (SUTURE) ×3 IMPLANT
SUT VICRYL+ 3-0 144IN (SUTURE) IMPLANT
SYR 3ML LL SCALE MARK (SYRINGE) IMPLANT
SYR CONTROL 10ML (SYRINGE) ×3 IMPLANT
TROCAR XCEL UNIV SLVE 11M 100M (ENDOMECHANICALS) ×3 IMPLANT
TUBING INSUFFLATOR HI FLOW (MISCELLANEOUS) ×3 IMPLANT

## 2015-06-02 NOTE — Anesthesia Postprocedure Evaluation (Signed)
Anesthesia Post Note  Patient: Susan Pineda  Procedure(s) Performed: Procedure(s) (LRB): HERNIA REPAIR VENTRAL ADULT, laparoscopic (N/A)  Patient location during evaluation: PACU Anesthesia Type: General Level of consciousness: awake and alert Pain management: pain level controlled Vital Signs Assessment: post-procedure vital signs reviewed and stable Respiratory status: spontaneous breathing, nonlabored ventilation, respiratory function stable and patient connected to nasal cannula oxygen Cardiovascular status: blood pressure returned to baseline and stable Postop Assessment: no signs of nausea or vomiting Anesthetic complications: no    Last Vitals:  Filed Vitals:   06/02/15 0933 06/02/15 0948  BP: 109/70 99/72  Pulse: 64 57  Temp:  36.7 C  Resp: 16 15    Last Pain:  Filed Vitals:   06/02/15 0953  PainSc: 5                  Precious Haws Benno Brensinger

## 2015-06-02 NOTE — Anesthesia Preprocedure Evaluation (Signed)
Anesthesia Evaluation  Patient identified by MRN, date of birth, ID band Patient awake    Reviewed: Allergy & Precautions, H&P , NPO status , Patient's Chart, lab work & pertinent test results  History of Anesthesia Complications Negative for: history of anesthetic complications  Airway Mallampati: III  TM Distance: >3 FB Neck ROM: full    Dental  (+) Poor Dentition, Chipped   Pulmonary neg shortness of breath, COPD, Current Smoker,    Pulmonary exam normal breath sounds clear to auscultation       Cardiovascular Exercise Tolerance: Good hypertension, (-) angina(-) Past MI and (-) DOE Normal cardiovascular exam Rhythm:regular Rate:Normal     Neuro/Psych negative neurological ROS  negative psych ROS   GI/Hepatic Neg liver ROS, GERD  Controlled,  Endo/Other  diabetes, Type 2Hypothyroidism   Renal/GU Renal disease  negative genitourinary   Musculoskeletal   Abdominal   Peds  Hematology negative hematology ROS (+)   Anesthesia Other Findings Past Medical History:   Hypertension                                                 Diabetes mellitus without complication ([place])                 Hyperlipidemia                                               Hypothyroidism                                               Chronic kidney disease                                         Comment:KIDNEY STONES   Diverticulitis                                               Cancer ([place])                                    2012           Comment:THYROID   GERD (gastroesophageal reflux disease)                       Anemia                                                         Comment:h/o  Past Surgical History:   THYROIDECTOMY  ABDOMINAL HYSTERECTOMY                                        LITHOTRIPSY                                                     Comment:X2   SIGMOIDECTOMY                                     2011           Comment:colovaginal fistula/diverticulitis   HERNIA REPAIR                                                 PARATHYROIDECTOMY                               Right              SUBMANDIBULAR GLAND EXCISION                    Right 12/18/2014      Comment:Procedure: Right submandibular gland and               subligual gland excision;  Surgeon: Carloyn Manner, MD;  Location: [facility] ORS;  Service: ENT;              Laterality: Right;   HERNIA REPAIR                                    2012, 2013     Comment:Dr Wilton Smith  BMI    Body Mass Index   36.48 kg/m 2      Reproductive/Obstetrics negative OB ROS                             Anesthesia Physical Anesthesia Plan  ASA: III  Anesthesia Plan: General ETT   Post-op Pain Management:    Induction:   Airway Management Planned:   Additional Equipment:   Intra-op Plan:   Post-operative Plan:   Informed Consent: I have reviewed the patients History and Physical, chart, labs and discussed the procedure including the risks, benefits and alternatives for the proposed anesthesia with the patient or authorized representative who has indicated his/her understanding and acceptance.   Dental Advisory Given  Plan Discussed with: Anesthesiologist, CRNA and Surgeon  Anesthesia Plan Comments:         Anesthesia Quick Evaluation

## 2015-06-02 NOTE — Op Note (Signed)
Preoperative diagnosis: Ventral/umbilical hernia.  Postoperative diagnosis: Same.  Operative procedure: Laparoscopy, repair of ventral hernia with 4 x 6" ventral light ST mesh.  Operative surgeon: Hervey Ard, M.D.  Anesthesia: Gen. endotracheal.  Clinical note: This 59 year old smoker recurrent abdominal hernia. CT scan showed weakness in the lower abdomen but noted frank hernia. There was a significant umbilical defect including small bowel and omentum. Plans for laparoscopy to assess the anterior abdominal wall and to repair the known umbilical/ventral hernia were reviewed. The likelihood that the "pooch" in the lower abdomen would be unchanged post procedure was discussed in detail.  Operative note: With the patient under adequate general anesthesia a Foley catheter was placed by the nurse. This was clamped to help visualize the bladder showed a lower mesh be required. It was removed at the end of the procedure without incident.  The patient received Kefzol prior the procedure. She had SCD stockings for DVT prevention. The abdomen was prepped with ChloraPrep and draped. A varies needle was placed in the left upper quadrant. After assuring intra-abdominal location with the hanging drop test the abdomen was insufflate with CO2 at 10 mmHg pressure. A 5 mm step port was expanded without difficulty and there was no evidence of injury from initial port placement. The anterior abdominal wall was viewed and there was a defect at the umbilicus with all contents reduced. There was no defect below the umbilical level. No other intra-abdominal pathology was appreciated. A 11 mm XL port and a 5 mm step port were placed along the anterior axillary line for the procedure. The. Medium overlying the hernia defect was excised with cautery and discarded. A 4 x 6" ventral light mesh was brought into the field and placed intraperitoneal. This was brought up through an incision just below the umbilical fold and the  balloon inflated. The mesh was then tacked in 4 corners with 0 Maxon sutures through and through the abdominal wall. Prior to tying the fourth of corner sutures the abdomen pressure was dropped to 5 mm and the mesh smoothed into position. The Ethicon secure strap staple system was then used to go around the peripheral nerve mesh and the interior of the mesh adjacent to the hernia defect. Smooth apposition to the anterior abdominal wall was noted. The abdomen was reinsufflated to 10 mm pressure. Good hemostasis was noted. The 11 mm port site was closed with a 0 Surgilon suture with trans-fascial suture placement. Skin incisions were closed with 4-0 Vicryl septic or sutures. Benzoin, Steri-Strips, Telfa and Tegaderm dressings were applied to all the warts sites. Steri-Strips were used over the suture sites. Telfa pad applied to the anterior abdominal wall. The patient was taken recovery room with only mild coughing during extubation.

## 2015-06-02 NOTE — Transfer of Care (Signed)
Immediate Anesthesia Transfer of Care Note  Patient: Susan Pineda  Procedure(s) Performed: Procedure(s): HERNIA REPAIR VENTRAL ADULT, laparoscopic (N/A)  Patient Location: PACU  Anesthesia Type:General  Level of Consciousness: sedated and responds to stimulation  Airway & Oxygen Therapy: Patient Spontanous Breathing and Patient connected to face mask oxygen  Post-op Assessment: Report given to RN and Post -op Vital signs reviewed and stable  Post vital signs: Reviewed and stable  Last Vitals:  Filed Vitals:   06/02/15 0623 06/02/15 0903  BP: 110/65 93/49  Pulse: 54 50  Temp: 36.9 C 36.5 C  Resp: 14 18    Complications: No apparent anesthesia complications

## 2015-06-02 NOTE — Anesthesia Procedure Notes (Signed)
Procedure Name: Intubation Date/Time: 06/02/2015 7:43 AM Performed by: Jonna Clark Pre-anesthesia Checklist: Patient identified, Patient being monitored, Timeout performed, Emergency Drugs available and Suction available Patient Re-evaluated:Patient Re-evaluated prior to inductionOxygen Delivery Method: Circle system utilized Preoxygenation: Pre-oxygenation with 100% oxygen Intubation Type: IV induction Ventilation: Mask ventilation without difficulty Laryngoscope Size: Mac and 3 Grade View: Grade I Tube type: Oral Tube size: 7.0 mm Number of attempts: 1 Placement Confirmation: ETT inserted through vocal cords under direct vision,  positive ETCO2 and breath sounds checked- equal and bilateral Secured at: 21 cm Tube secured with: Tape Dental Injury: Teeth and Oropharynx as per pre-operative assessment

## 2015-06-02 NOTE — Discharge Instructions (Addendum)

## 2015-06-02 NOTE — H&P (Signed)
For ventral hernia repair. CT results reviewed: Defect at umbilicus, thinning of fascia lower without a discrete defect. Plan: Laparoscopy, repair of umbilical defect, assessment of lower abdominal wall.  No change in cardiopulmonary exam.

## 2015-06-08 ENCOUNTER — Encounter: Payer: Self-pay | Admitting: Family Medicine

## 2015-06-08 ENCOUNTER — Ambulatory Visit (INDEPENDENT_AMBULATORY_CARE_PROVIDER_SITE_OTHER): Payer: 59 | Admitting: Family Medicine

## 2015-06-08 VITALS — BP 96/65 | HR 71 | Temp 99.0°F | Ht 61.7 in | Wt 191.0 lb

## 2015-06-08 DIAGNOSIS — C73 Malignant neoplasm of thyroid gland: Secondary | ICD-10-CM | POA: Insufficient documentation

## 2015-06-08 DIAGNOSIS — E119 Type 2 diabetes mellitus without complications: Secondary | ICD-10-CM | POA: Diagnosis not present

## 2015-06-08 DIAGNOSIS — E890 Postprocedural hypothyroidism: Secondary | ICD-10-CM | POA: Insufficient documentation

## 2015-06-08 DIAGNOSIS — E785 Hyperlipidemia, unspecified: Secondary | ICD-10-CM | POA: Diagnosis not present

## 2015-06-08 DIAGNOSIS — I10 Essential (primary) hypertension: Secondary | ICD-10-CM

## 2015-06-08 DIAGNOSIS — E039 Hypothyroidism, unspecified: Secondary | ICD-10-CM | POA: Insufficient documentation

## 2015-06-08 LAB — BAYER DCA HB A1C WAIVED: HB A1C: 5.8 % (ref <7.0)

## 2015-06-08 MED ORDER — PRAVASTATIN SODIUM 40 MG PO TABS: 40.0000 mg | ORAL_TABLET | Freq: Every day | ORAL | Status: DC

## 2015-06-08 MED ORDER — LISINOPRIL 5 MG PO TABS: 5.0000 mg | ORAL_TABLET | ORAL | Status: DC

## 2015-06-08 NOTE — Progress Notes (Signed)
BP 96/65 mmHg  Pulse 71  Temp(Src) 99 F (37.2 C)  Ht 5' 1.7" (1.567 m)  Wt 191 lb (86.637 kg)  BMI 35.28 kg/m2  SpO2 99%   Subjective:    Patient ID: Susan Pineda, female    DOB: Sep 26, 1955, 59 y.o.   MRN: [id number]  HPI: Susan Pineda is a 59 y.o. female  Chief Complaint  Patient presents with  . Diabetes   Diabetes doing well with no low blood sugar spells had hernia repair surgery and is recovering well stopped medicine during that time had no issues with blood sugars. The pressure doing well no complaints from medications Lipids doing well no complaints medications Relevant past medical, surgical, family and social history reviewed and updated as indicated. Interim medical history since our last visit reviewed. Allergies and medications reviewed and updated. Patient also with CT showing some fatty liver reviewed weight loss Review of Systems  Constitutional: Negative.   Respiratory: Negative.   Cardiovascular: Negative.     Per HPI unless specifically indicated above     Objective:    BP 96/65 mmHg  Pulse 71  Temp(Src) 99 F (37.2 C)  Ht 5' 1.7" (1.567 m)  Wt 191 lb (86.637 kg)  BMI 35.28 kg/m2  SpO2 99%  Wt Readings from Last 3 Encounters:  06/08/15 191 lb (86.637 kg)  06/02/15 193 lb (87.544 kg)  05/20/15 193 lb 12.8 oz (87.907 kg)    Physical Exam  Constitutional: She is oriented to person, place, and time. She appears well-developed and well-nourished. No distress.  HENT:  Head: Normocephalic and atraumatic.  Right Ear: Hearing normal.  Left Ear: Hearing normal.  Nose: Nose normal.  Eyes: Conjunctivae and lids are normal. Right eye exhibits no discharge. Left eye exhibits no discharge. No scleral icterus.  Cardiovascular: Normal rate, regular rhythm and normal heart sounds.   Pulmonary/Chest: Effort normal and breath sounds normal. No respiratory distress.  Musculoskeletal: Normal range of motion.  Neurological: She is alert and oriented to  person, place, and time.  Skin: Skin is intact. No rash noted.  Psychiatric: She has a normal mood and affect. Her speech is normal and behavior is normal. Judgment and thought content normal. Cognition and memory are normal.    Results for orders placed or performed during the hospital encounter of 06/02/15  Glucose, capillary  Result Value Ref Range   Glucose-Capillary 102 (H) 65 - 99 mg/dL   Comment 1 Notify RN   Glucose, capillary  Result Value Ref Range   Glucose-Capillary 111 (H) 65 - 99 mg/dL      Assessment & Plan:   Problem List Items Addressed This Visit      Cardiovascular and Mediastinum   Hypertension - Primary    The current medical regimen is effective;  continue present plan and medications.       Relevant Medications   lisinopril (PRINIVIL,ZESTRIL) 5 MG tablet   pravastatin (PRAVACHOL) 40 MG tablet     Endocrine   Malignant neoplasm of thyroid gland ([facility])   Hypothyroidism   Diabetes mellitus without complication ([place])    The current medical regimen is effective;  continue present plan and medications.       Relevant Medications   lisinopril (PRINIVIL,ZESTRIL) 5 MG tablet   pravastatin (PRAVACHOL) 40 MG tablet     Other   Hyperlipidemia   Relevant Medications   lisinopril (PRINIVIL,ZESTRIL) 5 MG tablet   pravastatin (PRAVACHOL) 40 MG tablet  Follow up plan: Return in about 3 months (around 09/06/2015) for Physical Exam a1c and labs.

## 2015-06-08 NOTE — Assessment & Plan Note (Signed)
The current medical regimen is effective;  continue present plan and medications.  

## 2015-06-09 ENCOUNTER — Encounter: Payer: Self-pay | Admitting: General Surgery

## 2015-06-09 ENCOUNTER — Ambulatory Visit (INDEPENDENT_AMBULATORY_CARE_PROVIDER_SITE_OTHER): Payer: 59 | Admitting: General Surgery

## 2015-06-09 VITALS — BP 118/60 | HR 76 | Temp 97.1°F | Resp 12 | Ht 61.0 in | Wt 191.0 lb

## 2015-06-09 DIAGNOSIS — K432 Incisional hernia without obstruction or gangrene: Secondary | ICD-10-CM

## 2015-06-09 NOTE — Patient Instructions (Addendum)
Patient to return in one month.  The patient is aware to use a heating pad as needed for comfort. 

## 2015-06-09 NOTE — Progress Notes (Signed)
Patient ID: Susan Pineda, female   DOB: Dec 16, 1955, 59 y.o.   MRN: [id number]  Chief Complaint  Patient presents with  . Routine Post Op    ventral hernia repair     HPI Susan Pineda is a 59 y.o. female here today for her post op ventral hernia repair done on 06/01/15. Patient states she is still sore and have had off and on low grade fevers high as 99.7.Today's temp 97.1. Almost back to normal on her bowel movements. Pain with motion and especially bending/laying flat slowly improving.   I personally reviewed the above history.  HPI     Past Medical History  Diagnosis Date  . Hypertension   . Diabetes mellitus without complication ([place])   . Hyperlipidemia   . Hypothyroidism   . Chronic kidney disease     KIDNEY STONES  . Diverticulitis   . GERD (gastroesophageal reflux disease)   . Anemia     h/o    Past Surgical History  Procedure Laterality Date  . Thyroidectomy    . Abdominal hysterectomy    . Lithotripsy      X2  . 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 Hernia repair    . Parathyroidectomy Right   . Submandibular gland excision Right 12/18/2014    Procedure: Right submandibular gland and subligual gland excision;  Surgeon: Carloyn Manner, MD;  Location: [facility] ORS;  Service: ENT;  Laterality: Right;  . Hernia repair  2012, 2013    Dr Rochel Brome  . Ventral hernia repair N/A 06/02/2015    Procedure: HERNIA REPAIR VENTRAL ADULT, laparoscopic;  Surgeon: Robert Bellow, MD;  Location: [facility] ORS;  Service: General;  Laterality: N/A;    Family History  Problem Relation Age of Onset  . Cancer Mother   . Cancer Father     Social History Social History  Substance Use Topics  . Smoking status: Current Some Day Smoker -- 0.25 packs/day for 42 years    Types: Cigarettes  . Smokeless tobacco: Never Used  . Alcohol Use: No    No Known Allergies  Current Outpatient Prescriptions  Medication Sig Dispense Refill  . aspirin EC 81 MG tablet  Take 81 mg by mouth daily.    . Cinnamon 500 MG TABS Take 1 tablet by mouth daily.    . Cranberry 1000 MG CAPS Take 1 capsule by mouth daily.    . Famotidine-Ca Carb-Mag Hydrox (PEPCID COMPLETE PO) Take 1 tablet by mouth as needed.    Marland Kitchen glucose blood (FREESTYLE LITE) test strip 1 a day dx W11.9 100 each 12  . HYDROcodone-acetaminophen (NORCO) 5-325 MG tablet Take 1-2 tablets by mouth every 4 (four) hours as needed for moderate pain. 30 tablet 0  . INVOKANA 300 MG TABS tablet Take 1 tablet by mouth  daily 90 tablet 1  . Lancets (ACCU-CHEK MULTICLIX) lancets Use as instructed 100 each 12  . levothyroxine (SYNTHROID) 100 MCG tablet Take 100 mcg by mouth daily before breakfast.    . lisinopril (PRINIVIL,ZESTRIL) 5 MG tablet Take 1 tablet (5 mg total) by mouth every morning. 90 tablet 1  . metFORMIN (GLUCOPHAGE) 500 MG tablet Take 1 tablet by mouth  twice a day 180 tablet 6  . pravastatin (PRAVACHOL) 40 MG tablet Take 1 tablet (40 mg total) by mouth at bedtime. 90 tablet 1  . Probiotic Product (PROBIOTIC DAILY PO) Take 1 capsule by mouth daily.     No current facility-administered medications for  this visit.    Review of Systems Review of Systems  Constitutional: Negative.   Respiratory: Negative.   Cardiovascular: Negative.     Blood pressure 118/60, pulse 76, temperature 97.1 F (36.2 C), resp. rate 12, height 5\' 1"  (1.549 m), weight 191 lb (86.637 kg).  Physical Exam Physical Exam  Constitutional: She is oriented to person, place, and time. She appears well-developed and well-nourished.  Abdominal: Soft. Normal appearance.    Port site are clean and healing well.   Neurological: She is alert and oriented to person, place, and time.  Skin: Skin is warm and dry.      Assessment    Doing well post ventral hernia repair.     Plan    Local heat for comfort. Proper lifting technique reviewed. Patient encouraged to avoid twisting while lifting.   Anticipate return to work  07/03/2015.     Patient to return in one month. PCP:  Fuller Canada 06/10/2015, 4:27 PM

## 2015-07-01 ENCOUNTER — Encounter: Payer: Self-pay | Admitting: *Deleted

## 2015-07-01 DIAGNOSIS — K432 Incisional hernia without obstruction or gangrene: Secondary | ICD-10-CM

## 2015-07-01 NOTE — Progress Notes (Signed)
She called to states she does not feel she can go back to work as of yet. She is still having trouble sitting, she has to lean to the side, uncomfortable. Difficult to bend over, states she just "can't put her finger on what's wrong". She has been using an antiinflammatory and heating pad(as previously suggested) with not relief. She states it just feels like "something is not right". Denies any fever, chills, redness or drainage. Bowels moving daily. Offered appointment to see Dr Jamal Collin, pt declined. Requested note to stay out of work until she see Dr Bary Castilla next week, faxed to [facility] (done).

## 2015-07-01 NOTE — Patient Instructions (Signed)
The patient is aware to call back for any questions or concerns.  

## 2015-07-09 ENCOUNTER — Ambulatory Visit (INDEPENDENT_AMBULATORY_CARE_PROVIDER_SITE_OTHER): Payer: 59 | Admitting: General Surgery

## 2015-07-09 VITALS — BP 116/68 | HR 72 | Resp 12 | Ht 60.0 in | Wt 193.0 lb

## 2015-07-09 DIAGNOSIS — G8928 Other chronic postprocedural pain: Secondary | ICD-10-CM

## 2015-07-09 DIAGNOSIS — K432 Incisional hernia without obstruction or gangrene: Secondary | ICD-10-CM

## 2015-07-09 MED ORDER — GABAPENTIN 300 MG PO CAPS: 300.0000 mg | ORAL_CAPSULE | Freq: Three times a day (TID) | ORAL | Status: DC

## 2015-07-09 NOTE — Progress Notes (Signed)
Patient ID: Susan Pineda, female   DOB: 1955-11-01, 60 y.o.   MRN: [id number]  Chief Complaint  Patient presents with  . Routine Post Op    ventral hernia    HPI Susan Pineda is a 60 y.o. female here today for her post op ventral hernia repair done on 06/02/15. She states the umbilical area is sore and very hard to sit because she states she feels something sticking her in the belly.  The patient reports that she was out of work for 8 and 12 weeks respectively after her last 2 ventral hernia repairs.  I reviewed the patient's history. HPI  Past Medical History  Diagnosis Date  . Hypertension   . Diabetes mellitus without complication ([place])   . Hyperlipidemia   . Hypothyroidism   . Chronic kidney disease     KIDNEY STONES  . Diverticulitis   . GERD (gastroesophageal reflux disease)   . Anemia     h/o    Past Surgical History  Procedure Laterality Date  . Thyroidectomy    . Abdominal hysterectomy    . Lithotripsy      X2  . 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 Hernia repair    . Parathyroidectomy Right   . Submandibular gland excision Right 12/18/2014    Procedure: Right submandibular gland and subligual gland excision;  Surgeon: Carloyn Manner, MD;  Location: [facility] ORS;  Service: ENT;  Laterality: Right;  . Hernia repair  2012, 2013    Dr Rochel Brome  . Ventral hernia repair N/A 06/02/2015    Procedure: HERNIA REPAIR VENTRAL ADULT, laparoscopic;  Surgeon: Robert Bellow, MD;  Location: [facility] ORS;  Service: General;  Laterality: N/A;    Family History  Problem Relation Age of Onset  . Cancer Mother   . Cancer Father     Social History Social History  Substance Use Topics  . Smoking status: Current Some Day Smoker -- 0.25 packs/day for 42 years    Types: Cigarettes  . Smokeless tobacco: Never Used  . Alcohol Use: No    No Known Allergies  Current Outpatient Prescriptions  Medication Sig Dispense Refill  . aspirin EC 81 MG tablet  Take 81 mg by mouth daily.    . Cinnamon 500 MG TABS Take 1 tablet by mouth daily.    . Cranberry 1000 MG CAPS Take 1 capsule by mouth daily.    . Famotidine-Ca Carb-Mag Hydrox (PEPCID COMPLETE PO) Take 1 tablet by mouth as needed.    . gabapentin (NEURONTIN) 300 MG capsule Take 1 capsule (300 mg total) by mouth 3 (three) times daily. 90 capsule 0  . glucose blood (FREESTYLE LITE) test strip 1 a day dx W11.9 100 each 12  . HYDROcodone-acetaminophen (NORCO) 5-325 MG tablet Take 1-2 tablets by mouth every 4 (four) hours as needed for moderate pain. 30 tablet 0  . INVOKANA 300 MG TABS tablet Take 1 tablet by mouth  daily 90 tablet 1  . Lancets (ACCU-CHEK MULTICLIX) lancets Use as instructed 100 each 12  . levothyroxine (SYNTHROID) 100 MCG tablet Take 100 mcg by mouth daily before breakfast.    . lisinopril (PRINIVIL,ZESTRIL) 5 MG tablet Take 1 tablet (5 mg total) by mouth every morning. 90 tablet 1  . metFORMIN (GLUCOPHAGE) 500 MG tablet Take 1 tablet by mouth  twice a day 180 tablet 6  . pravastatin (PRAVACHOL) 40 MG tablet Take 1 tablet (40 mg total) by mouth at bedtime. 90 tablet  1  . Probiotic Product (PROBIOTIC DAILY PO) Take 1 capsule by mouth daily.     No current facility-administered medications for this visit.    Review of Systems Review of Systems  Constitutional: Negative.   Respiratory: Negative.     Blood pressure 116/68, pulse 72, resp. rate 12, height 5' (1.524 m), weight 193 lb (87.544 kg).  Physical Exam Physical Exam  Constitutional: She is oriented to person, place, and time. She appears well-developed and well-nourished.  Abdominal: Soft. Normal appearance.    No evidence of weakness with legs elevated off the examining table or with cough. Negative examined standing position.  Neurological: She is alert and oriented to person, place, and time.  Skin: Skin is warm and dry.    Data Reviewed Operative report.  Assessment    Postoperative pain, prolonged.  Minimal improvement with conservative measures.    Plan   Possible nerve irritation from secure strap tacks, less likely trans-fascial sutures.      Patient to return in 2 weeks after trial of Neurontin. She will take 1 300 mg tablets at night, begin twice a day dosing tomorrow and 3 times a day dosing on the following day. She'll continue on 300 mg 3 times a day until follow-up.  Possible need for repeat exam/laparoscopy/mesh removal of her symptoms persist was discussed.   This information has been scribed by Gaspar Cola CMA.   Robert Bellow 07/11/2015, 9:18 AM

## 2015-07-11 DIAGNOSIS — G8928 Other chronic postprocedural pain: Secondary | ICD-10-CM | POA: Insufficient documentation

## 2015-07-21 ENCOUNTER — Ambulatory Visit (INDEPENDENT_AMBULATORY_CARE_PROVIDER_SITE_OTHER): Payer: 59 | Admitting: General Surgery

## 2015-07-21 ENCOUNTER — Encounter: Payer: Self-pay | Admitting: General Surgery

## 2015-07-21 VITALS — BP 128/66 | HR 66 | Resp 12 | Ht 62.0 in | Wt 196.0 lb

## 2015-07-21 DIAGNOSIS — G8928 Other chronic postprocedural pain: Secondary | ICD-10-CM

## 2015-07-21 DIAGNOSIS — K432 Incisional hernia without obstruction or gangrene: Secondary | ICD-10-CM

## 2015-07-21 MED ORDER — GABAPENTIN 300 MG PO CAPS: 300.0000 mg | ORAL_CAPSULE | Freq: Three times a day (TID) | ORAL | Status: DC

## 2015-07-21 NOTE — Progress Notes (Signed)
Patient ID: Susan Pineda, female   DOB: [birth year], 60 y.o.   MRN: [id number]  Chief Complaint  Patient presents with  . Routine Post Op    Ventral Hernia    HPI Susan Pineda is a 60 y.o. female  here today for her post op ventral hernia repair done on 06/02/15. She states the umbilical area is still a little sore, she states it is a lot better. The hypersensitivity previously noted in the area to the right and inferior to the umbilicus has nearly completely resolved. She has been taking the Neurontin which she states has helped, has some tingling/numbness in her hands and feet more so felt in her hands which comes and goes. The visual disturbances and peripheral extremity symptoms were most notable while she was escalating her dose to 300 mg 3 times a day.  I person reviewed the patient's history. HPI  Past Medical History  Diagnosis Date  . Hypertension   . Diabetes mellitus without complication ([place])   . Hyperlipidemia   . Hypothyroidism   . Chronic kidney disease     KIDNEY STONES  . Diverticulitis   . GERD (gastroesophageal reflux disease)   . Anemia     h/o    Past Surgical History  Procedure Laterality Date  . Thyroidectomy    . Abdominal hysterectomy    . Lithotripsy      X2  . 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 Hernia repair    . Parathyroidectomy Right   . Submandibular gland excision Right 12/18/2014    Procedure: Right submandibular gland and subligual gland excision;  Surgeon: Carloyn Manner, MD;  Location: [facility];  Service: ENT;  Laterality: Right;  . Hernia repair  2012, 2013    Dr Rochel Brome  . Ventral hernia repair N/A 06/02/2015    Procedure: HERNIA REPAIR VENTRAL ADULT, laparoscopic;  Surgeon: Robert Bellow, MD;  Location: [facility];  Service: General;  Laterality: N/A;    Family History  Problem Relation Age of Onset  . Cancer Mother   . Cancer Father     Social History Social History  Substance Use Topics  .  Smoking status: Current Some Day Smoker -- 0.25 packs/day for 42 years    Types: Cigarettes  . Smokeless tobacco: Never Used  . Alcohol Use: No    No Known Allergies  Current Outpatient Prescriptions  Medication Sig Dispense Refill  . aspirin EC 81 MG tablet Take 81 mg by mouth daily.    . Cinnamon 500 MG TABS Take 1 tablet by mouth daily.    . Cranberry 1000 MG CAPS Take 1 capsule by mouth daily.    . Famotidine-Ca Carb-Mag Hydrox (PEPCID COMPLETE PO) Take 1 tablet by mouth as needed.    . gabapentin (NEURONTIN) 300 MG capsule Take 1 capsule (300 mg total) by mouth 3 (three) times daily. 90 capsule 1  . glucose blood (FREESTYLE LITE) test strip 1 a day dx W11.9 100 each 12  . INVOKANA 300 MG TABS tablet Take 1 tablet by mouth  daily 90 tablet 1  . Lancets (ACCU-CHEK MULTICLIX) lancets Use as instructed 100 each 12  . levothyroxine (SYNTHROID) 100 MCG tablet Take 100 mcg by mouth daily before breakfast.    . lisinopril (PRINIVIL,ZESTRIL) 5 MG tablet Take 1 tablet (5 mg total) by mouth every morning. 90 tablet 1  . metFORMIN (GLUCOPHAGE) 500 MG tablet Take 1 tablet by mouth  twice a day 180 tablet  6  . pravastatin (PRAVACHOL) 40 MG tablet Take 1 tablet (40 mg total) by mouth at bedtime. 90 tablet 1  . Probiotic Product (PROBIOTIC DAILY PO) Take 1 capsule by mouth daily.     No current facility-administered medications for this visit.    Review of Systems Review of Systems  Constitutional: Negative.   Respiratory: Negative.   Cardiovascular: Negative.     Blood pressure 128/66, pulse 66, resp. rate 12, height 5\' 2"  (1.575 m), weight 196 lb (88.905 kg).  Physical Exam Physical Exam  Constitutional: She is oriented to person, place, and time. She appears well-developed and well-nourished.  Eyes: Conjunctivae are normal. No scleral icterus.  Abdominal: Soft. Bowel sounds are normal.  Neurological: She is alert and oriented to person, place, and time.  Skin: Skin is warm and dry.       Assessment    Marked improvement with abdominal symptoms with institution of Neurontin, 3 mg by mouth 3 times a day.    Plan     Continue taking Neurontin 3 times a day for 1 month follow up after. Call with any concerns or questions.    The patient will be able to return to work in 1 week. Proper lifting technique reviewed   This information has been scribed by Verlene Mayer, CMA       PCP: Dr. Dawayne Patricia, [place] 07/22/2015, 9:31 PM

## 2015-07-21 NOTE — Patient Instructions (Signed)
Continue taking Neurontin 3 times a day for 1 month follow up after. Call with any concerns or questions.

## 2015-07-23 ENCOUNTER — Ambulatory Visit: Payer: 59 | Admitting: General Surgery

## 2015-08-20 ENCOUNTER — Encounter: Payer: Self-pay | Admitting: General Surgery

## 2015-08-20 ENCOUNTER — Ambulatory Visit (INDEPENDENT_AMBULATORY_CARE_PROVIDER_SITE_OTHER): Payer: 59 | Admitting: General Surgery

## 2015-08-20 VITALS — BP 106/58 | HR 78 | Resp 12 | Ht 62.0 in | Wt 194.0 lb

## 2015-08-20 DIAGNOSIS — K432 Incisional hernia without obstruction or gangrene: Secondary | ICD-10-CM

## 2015-08-20 NOTE — Progress Notes (Signed)
Patient ID: Susan Pineda, female   DOB: [birth year], 60 y.o.   MRN: [id number]  Chief Complaint  Patient presents with  . Routine Post Op    HPI Susan Pineda is a 60 y.o. female.  Here today for follow up ventral hernia surgery done 06-02-15. She is tolerating the gabapentin and states she is not having any abdominal pain.  I reviewed the patient's history.   HPI  Past Medical History  Diagnosis Date  . Hypertension   . Diabetes mellitus without complication ([place])   . Hyperlipidemia   . Hypothyroidism   . Chronic kidney disease     KIDNEY STONES  . Diverticulitis   . GERD (gastroesophageal reflux disease)   . Anemia     h/o    Past Surgical History  Procedure Laterality Date  . Thyroidectomy    . Abdominal hysterectomy    . Lithotripsy      X2  . 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 Hernia repair    . Parathyroidectomy Right   . Submandibular gland excision Right 12/18/2014    Procedure: Right submandibular gland and subligual gland excision;  Surgeon: Carloyn Manner, MD;  Location: [facility];  Service: ENT;  Laterality: Right;  . Hernia repair  2012, 2013    Dr Rochel Brome  . Ventral hernia repair N/A 06/02/2015    Procedure: HERNIA REPAIR VENTRAL ADULT, laparoscopic;  Surgeon: Robert Bellow, MD;  Location: [facility];  Service: General;  Laterality: N/A;    Family History  Problem Relation Age of Onset  . Cancer Mother   . Cancer Father     Social History Social History  Substance Use Topics  . Smoking status: Former Smoker -- 0.25 packs/day for 42 years    Types: Cigarettes    Quit date: 08/05/2015  . Smokeless tobacco: Never Used  . Alcohol Use: No    No Known Allergies  Current Outpatient Prescriptions  Medication Sig Dispense Refill  . aspirin EC 81 MG tablet Take 81 mg by mouth daily.    . Cinnamon 500 MG TABS Take 1 tablet by mouth daily.    . Cranberry 1000 MG CAPS Take 1 capsule by mouth daily.    . Famotidine-Ca  Carb-Mag Hydrox (PEPCID COMPLETE PO) Take 1 tablet by mouth as needed.    . gabapentin (NEURONTIN) 300 MG capsule Take 1 capsule (300 mg total) by mouth 3 (three) times daily. 90 capsule 1  . glucose blood (FREESTYLE LITE) test strip 1 a day dx W11.9 100 each 12  . INVOKANA 300 MG TABS tablet Take 1 tablet by mouth  daily 90 tablet 1  . Lancets (ACCU-CHEK MULTICLIX) lancets Use as instructed 100 each 12  . levothyroxine (SYNTHROID) 100 MCG tablet Take 100 mcg by mouth daily before breakfast.    . lisinopril (PRINIVIL,ZESTRIL) 5 MG tablet Take 1 tablet (5 mg total) by mouth every morning. 90 tablet 1  . metFORMIN (GLUCOPHAGE) 500 MG tablet Take 1 tablet by mouth  twice a day 180 tablet 6  . pravastatin (PRAVACHOL) 40 MG tablet Take 1 tablet (40 mg total) by mouth at bedtime. 90 tablet 1  . Probiotic Product (PROBIOTIC DAILY PO) Take 1 capsule by mouth daily.     No current facility-administered medications for this visit.    Review of Systems Review of Systems  Constitutional: Negative.   Respiratory: Negative.   Cardiovascular: Negative.     Blood pressure 106/58, pulse 78, resp. rate  12, height 5\' 2"  (1.575 m), weight 194 lb (87.998 kg).  Physical Exam Physical Exam  Constitutional: She is oriented to person, place, and time. She appears well-developed and well-nourished.  Abdominal: Soft. Normal appearance.    Ventral hernia repair is intact.   Neurological: She is alert and oriented to person, place, and time.  Skin: Skin is warm and dry.     Assessment    Improvement in abdominal pain with institution of Neurontin.    Plan    The patient will decrease her Neurontin to twice a day for 2 weeks and if she continues to do well decrease it to a daily dose for an additional 2 weeks. She may resume her increased dose of recurrent hypersensitivity develops.  Released to full activity. Upper lifting technique reviewed.   Patient to return in three months PCP:  [facility] This information has been scribed by Karie Fetch RNBC.    Robert Bellow 08/21/2015, 9:49 AM

## 2015-08-20 NOTE — Patient Instructions (Addendum)
The patient is aware to call back for any questions or concerns. Patient to return in three months.  

## 2015-08-27 ENCOUNTER — Other Ambulatory Visit: Payer: Self-pay | Admitting: Family Medicine

## 2015-08-27 NOTE — Telephone Encounter (Signed)
Your patient 

## 2015-09-08 ENCOUNTER — Encounter: Payer: Self-pay | Admitting: Family Medicine

## 2015-09-08 ENCOUNTER — Ambulatory Visit (INDEPENDENT_AMBULATORY_CARE_PROVIDER_SITE_OTHER): Payer: 59 | Admitting: Family Medicine

## 2015-09-08 VITALS — BP 92/60 | HR 56 | Temp 98.9°F | Ht 61.5 in | Wt 192.0 lb

## 2015-09-08 DIAGNOSIS — E119 Type 2 diabetes mellitus without complications: Secondary | ICD-10-CM

## 2015-09-08 DIAGNOSIS — Z113 Encounter for screening for infections with a predominantly sexual mode of transmission: Secondary | ICD-10-CM | POA: Diagnosis not present

## 2015-09-08 DIAGNOSIS — Z0000 Encounter for general adult medical examination without abnormal findings: Secondary | ICD-10-CM

## 2015-09-08 DIAGNOSIS — I10 Essential (primary) hypertension: Secondary | ICD-10-CM | POA: Diagnosis not present

## 2015-09-08 DIAGNOSIS — Z1239 Encounter for other screening for malignant neoplasm of breast: Secondary | ICD-10-CM

## 2015-09-08 DIAGNOSIS — E785 Hyperlipidemia, unspecified: Secondary | ICD-10-CM

## 2015-09-08 LAB — MICROSCOPIC EXAMINATION

## 2015-09-08 LAB — URINALYSIS, ROUTINE W REFLEX MICROSCOPIC
BILIRUBIN UA: NEGATIVE
KETONES UA: NEGATIVE
Leukocytes, UA: NEGATIVE
NITRITE UA: NEGATIVE
Protein, UA: NEGATIVE
SPEC GRAV UA: 1.015 (ref 1.005–1.030)
Urobilinogen, Ur: 0.2 mg/dL (ref 0.2–1.0)
pH, UA: 5 (ref 5.0–7.5)

## 2015-09-08 LAB — BAYER DCA HB A1C WAIVED: HB A1C (BAYER DCA - WAIVED): 6 % (ref <7.0)

## 2015-09-08 MED ORDER — PRAVASTATIN SODIUM 40 MG PO TABS: 40.0000 mg | ORAL_TABLET | Freq: Every day | ORAL | Status: DC

## 2015-09-08 MED ORDER — LEVOTHYROXINE SODIUM 100 MCG PO TABS: 100.0000 ug | ORAL_TABLET | Freq: Every day | ORAL | Status: DC

## 2015-09-08 MED ORDER — LISINOPRIL 5 MG PO TABS: 5.0000 mg | ORAL_TABLET | ORAL | Status: DC

## 2015-09-08 MED ORDER — METFORMIN HCL 500 MG PO TABS: ORAL_TABLET | ORAL | Status: DC

## 2015-09-08 MED ORDER — CANAGLIFLOZIN 300 MG PO TABS: ORAL_TABLET | ORAL | Status: DC

## 2015-09-08 NOTE — Progress Notes (Signed)
BP 92/60 mmHg  Pulse 56  Temp(Src) 98.9 F (37.2 C)  Ht 5' 1.5" (1.562 m)  Wt 192 lb (87.091 kg)  BMI 35.70 kg/m2  SpO2 99%   Subjective:    Patient ID: Susan Pineda, female    DOB: 07-12-1955, 60 y.o.   MRN: [id number]  HPI: Susan Pineda is a 60 y.o. female  Chief Complaint  Patient presents with  . Annual Exam  . Diabetes   patient follow-up doing well all in all is lost a few pounds feeling better taking gabapentin for skin sensitivity irritation where hernia repair and her abdomen was. Diabetes noted low blood sugar spells doing well Hypertension no complaints takes medications faithfully Thyroid no complaints from medicines. Cholesterol doing well  Relevant past medical, surgical, family and social history reviewed and updated as indicated. Interim medical history since our last visit reviewed. Allergies and medications reviewed and updated.  Review of Systems  Constitutional: Negative.   HENT: Negative.   Eyes: Negative.   Respiratory: Negative.   Cardiovascular: Negative.   Gastrointestinal: Negative.   Endocrine: Negative.   Genitourinary: Negative.   Musculoskeletal: Negative.   Skin: Negative.   Allergic/Immunologic: Negative.   Neurological: Negative.   Hematological: Negative.   Psychiatric/Behavioral: Negative.     Per HPI unless specifically indicated above     Objective:    BP 92/60 mmHg  Pulse 56  Temp(Src) 98.9 F (37.2 C)  Ht 5' 1.5" (1.562 m)  Wt 192 lb (87.091 kg)  BMI 35.70 kg/m2  SpO2 99%  Wt Readings from Last 3 Encounters:  09/08/15 192 lb (87.091 kg)  08/20/15 194 lb (87.998 kg)  07/21/15 196 lb (88.905 kg)    Physical Exam  Constitutional: She is oriented to person, place, and time. She appears well-developed and well-nourished.  HENT:  Head: Normocephalic and atraumatic.  Right Ear: External ear normal.  Left Ear: External ear normal.  Nose: Nose normal.  Mouth/Throat: Oropharynx is clear and moist.  Eyes:  Conjunctivae and EOM are normal. Pupils are equal, round, and reactive to light.  Neck: Normal range of motion. Neck supple. Carotid bruit is not present.  Cardiovascular: Normal rate, regular rhythm and normal heart sounds.   No murmur heard. Pulmonary/Chest: Effort normal and breath sounds normal. She exhibits no mass. Right breast exhibits no mass, no skin change and no tenderness. Left breast exhibits no mass, no skin change and no tenderness. Breasts are symmetrical.  Abdominal: Soft. Bowel sounds are normal. There is no hepatosplenomegaly.  Musculoskeletal: Normal range of motion.  Neurological: She is alert and oriented to person, place, and time.  Skin: No rash noted.  Psychiatric: She has a normal mood and affect. Her behavior is normal. Judgment and thought content normal.    Results for orders placed or performed in visit on 06/08/15  Bayer DCA Hb A1c Waived  Result Value Ref Range   Bayer DCA Hb A1c Waived 5.8 <7.0 %      Assessment & Plan:   Problem List Items Addressed This Visit      Cardiovascular and Mediastinum   Hypertension    The current medical regimen is effective;  continue present plan and medications.       Relevant Medications   pravastatin (PRAVACHOL) 40 MG tablet   lisinopril (PRINIVIL,ZESTRIL) 5 MG tablet     Endocrine   Diabetes mellitus without complication ([facility])   Relevant Medications   pravastatin (PRAVACHOL) 40 MG tablet   metFORMIN (GLUCOPHAGE) 500 MG  tablet   lisinopril (PRINIVIL,ZESTRIL) 5 MG tablet   canagliflozin (INVOKANA) 300 MG TABS tablet   Other Relevant Orders   Bayer DCA Hb A1c Waived     Other   Hyperlipidemia    The current medical regimen is effective;  continue present plan and medications.       Relevant Medications   pravastatin (PRAVACHOL) 40 MG tablet   lisinopril (PRINIVIL,ZESTRIL) 5 MG tablet    Other Visit Diagnoses    Routine general medical examination at a health care facility    -  Primary    Relevant  Orders    Urinalysis, Routine w reflex microscopic (not at [facility])    CBC with Differential/Platelet    Comprehensive metabolic panel    Lipid Panel w/o Chol/HDL Ratio    TSH    Routine screening for STI (sexually transmitted infection)        Relevant Orders    Hepatitis C Antibody    Breast cancer screening        Relevant Orders    MM Digital Screening    PE (physical exam), annual            Follow up plan: Return in about 3 months (around 12/09/2015) for A1C.

## 2015-09-08 NOTE — Assessment & Plan Note (Signed)
The current medical regimen is effective;  continue present plan and medications.  

## 2015-09-09 ENCOUNTER — Encounter: Payer: Self-pay | Admitting: Family Medicine

## 2015-09-09 LAB — COMPREHENSIVE METABOLIC PANEL
ALBUMIN: 4.2 g/dL (ref 3.5–5.5)
ALK PHOS: 82 IU/L (ref 39–117)
ALT: 16 IU/L (ref 0–32)
AST: 20 IU/L (ref 0–40)
Albumin/Globulin Ratio: 1.8 (ref 1.1–2.5)
BUN / CREAT RATIO: 25 — AB (ref 9–23)
BUN: 19 mg/dL (ref 6–24)
Bilirubin Total: 0.4 mg/dL (ref 0.0–1.2)
CHLORIDE: 99 mmol/L (ref 96–106)
CO2: 24 mmol/L (ref 18–29)
Calcium: 9.5 mg/dL (ref 8.7–10.2)
Creatinine, Ser: 0.77 mg/dL (ref 0.57–1.00)
GFR calc Af Amer: 98 mL/min/{1.73_m2} (ref >59)
GFR calc non Af Amer: 85 mL/min/{1.73_m2} (ref >59)
GLOBULIN, TOTAL: 2.3 g/dL (ref 1.5–4.5)
Glucose: 92 mg/dL (ref 65–99)
POTASSIUM: 4.9 mmol/L (ref 3.5–5.2)
Sodium: 137 mmol/L (ref 134–144)
Total Protein: 6.5 g/dL (ref 6.0–8.5)

## 2015-09-09 LAB — LIPID PANEL W/O CHOL/HDL RATIO
CHOLESTEROL TOTAL: 136 mg/dL (ref 100–199)
HDL: 44 mg/dL (ref >39)
LDL Calculated: 77 mg/dL (ref 0–99)
Triglycerides: 75 mg/dL (ref 0–149)
VLDL CHOLESTEROL CAL: 15 mg/dL (ref 5–40)

## 2015-09-09 LAB — CBC WITH DIFFERENTIAL/PLATELET
BASOS: 1 %
Basophils Absolute: 0.1 10*3/uL (ref 0.0–0.2)
EOS (ABSOLUTE): 0.2 10*3/uL (ref 0.0–0.4)
EOS: 2 %
HEMATOCRIT: 46.8 % — AB (ref 34.0–46.6)
HEMOGLOBIN: 15.3 g/dL (ref 11.1–15.9)
Immature Grans (Abs): 0 10*3/uL (ref 0.0–0.1)
Immature Granulocytes: 0 %
LYMPHS ABS: 2.2 10*3/uL (ref 0.7–3.1)
Lymphs: 24 %
MCH: 29.8 pg (ref 26.6–33.0)
MCHC: 32.7 g/dL (ref 31.5–35.7)
MCV: 91 fL (ref 79–97)
MONOCYTES: 13 %
Monocytes Absolute: 1.2 10*3/uL — ABNORMAL HIGH (ref 0.1–0.9)
NEUTROS ABS: 5.8 10*3/uL (ref 1.4–7.0)
Neutrophils: 60 %
Platelets: 369 10*3/uL (ref 150–379)
RBC: 5.13 x10E6/uL (ref 3.77–5.28)
RDW: 13.4 % (ref 12.3–15.4)
WBC: 9.5 10*3/uL (ref 3.4–10.8)

## 2015-09-09 LAB — TSH: TSH: 1.17 u[IU]/mL (ref 0.450–4.500)

## 2015-09-09 LAB — HEPATITIS C ANTIBODY

## 2015-09-17 ENCOUNTER — Ambulatory Visit
Admission: RE | Admit: 2015-09-17 | Discharge: 2015-09-17 | Disposition: A | Discharge status: Home / Self Care | Payer: 59 | Source: Ambulatory Visit | Attending: Family Medicine | Admitting: Family Medicine

## 2015-09-17 DIAGNOSIS — Z1239 Encounter for other screening for malignant neoplasm of breast: Secondary | ICD-10-CM

## 2015-09-17 DIAGNOSIS — Z1231 Encounter for screening mammogram for malignant neoplasm of breast: Secondary | ICD-10-CM | POA: Insufficient documentation

## 2015-11-17 ENCOUNTER — Encounter: Payer: Self-pay | Admitting: General Surgery

## 2015-11-17 ENCOUNTER — Ambulatory Visit (INDEPENDENT_AMBULATORY_CARE_PROVIDER_SITE_OTHER): Payer: 59 | Admitting: General Surgery

## 2015-11-17 VITALS — BP 106/66 | HR 74 | Resp 12 | Ht 61.0 in | Wt 200.0 lb

## 2015-11-17 DIAGNOSIS — K432 Incisional hernia without obstruction or gangrene: Secondary | ICD-10-CM

## 2015-11-17 NOTE — Progress Notes (Signed)
Patient ID: Susan Pineda, female   DOB: [birth year], 60 y.o.   MRN: [id number]  Chief Complaint  Patient presents with  . Follow-up    ventral hernia    HPI Susan Pineda is a 60 y.o. female here today for his follow up ventral hernia surgery 06/02/15. Patient states she is doing well.   I personally reviewed the patient's history. HPI  Past Medical History  Diagnosis Date  . Hypertension   . Diabetes mellitus without complication (Susan Pineda)   . Hyperlipidemia   . Hypothyroidism   . Chronic kidney disease     KIDNEY STONES  . Diverticulitis   . GERD (gastroesophageal reflux disease)   . Anemia     h/o  . Cancer (Susan Pineda) 2012    THYROID CA    Past Surgical History  Procedure Laterality Date  . Thyroidectomy    . Lithotripsy      X2  . 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 Hernia repair    . Parathyroidectomy Right   . Submandibular gland excision Right 12/18/2014    Procedure: Right submandibular gland and subligual gland excision;  Surgeon: Susan Manner, MD;  Location: [facility] OR[facility];  Service: ENT;  Laterality: Right;  . Hernia repair  2012, 2013    Dr Rochel Brome  . Ventral hernia repair N/A 06/02/2015    Procedure: HERNIA REPAIR VENTRAL ADULT, laparoscopic;  Surgeon: Susan Bellow, MD;  Location: [facility] OR[facility];  Service: General;  Laterality: N/A;  . Abdominal hysterectomy  1999    Family History  Problem Relation Age of Onset  . Cancer Mother   . Cancer Father   . Breast cancer Neg Hx     Social History Social History  Substance Use Topics  . Smoking status: Former Smoker -- 0.25 packs/day for 42 years    Types: Cigarettes    Quit date: 08/05/2015  . Smokeless tobacco: Never Used  . Alcohol Use: No    No Known Allergies  Current Outpatient Prescriptions  Medication Sig Dispense Refill  . aspirin EC 81 MG tablet Take 81 mg by mouth daily.    . canagliflozin (INVOKANA) 300 MG TABS tablet Take 1 tablet by mouth  daily 90 tablet 4  .  Cinnamon 500 MG TABS Take 1 tablet by mouth daily.    . Cranberry 1000 MG CAPS Take 1 capsule by mouth daily.    . Famotidine-Ca Carb-Mag Hydrox (PEPCID COMPLETE PO) Take 1 tablet by mouth as needed.    Marland Kitchen glucose blood (FREESTYLE LITE) test strip 1 a day dx W11.9 100 each 12  . Lancets (ACCU-CHEK MULTICLIX) lancets Use as instructed 100 each 12  . levothyroxine (SYNTHROID) 100 MCG tablet Take 1 tablet (100 mcg total) by mouth daily before breakfast. 90 tablet 4  . lisinopril (PRINIVIL,ZESTRIL) 5 MG tablet Take 1 tablet (5 mg total) by mouth every morning. 90 tablet 4  . metFORMIN (GLUCOPHAGE) 500 MG tablet Take 1 tablet by mouth  twice a day 180 tablet 4  . pravastatin (PRAVACHOL) 40 MG tablet Take 1 tablet (40 mg total) by mouth at bedtime. 90 tablet 4  . Probiotic Product (PROBIOTIC DAILY PO) Take 1 capsule by mouth daily.     No current facility-administered medications for this visit.    Review of Systems Review of Systems  Blood pressure 106/66, pulse 74, resp. rate 12, height 5\' 1"  (1.549 m), weight 200 lb (90.719 kg).  Physical Exam Physical Exam  Constitutional: She is  oriented to person, place, and time. She appears well-developed and well-nourished.  Eyes: Conjunctivae are normal. No scleral icterus.  Neck: Neck supple.  Cardiovascular: Normal rate, regular rhythm and normal heart sounds.   Pulmonary/Chest: Effort normal and breath sounds normal.  Abdominal: Soft. Normal appearance and bowel sounds are normal. There is no hepatomegaly. There is no tenderness. No hernia.    Ventral hernia is intact and doing well.   Lymphadenopathy:    She has no cervical adenopathy.  Neurological: She is alert and oriented to person, place, and time.  Skin: Skin is warm and dry.      Assessment    Doing well status post ventral hernia repair.    Plan       Patient to return in seven months. PCP:  [place] This information has been scribed by Gaspar Cola CMA.   Susan Pineda 11/18/2015, 8:24 PM

## 2015-11-17 NOTE — Patient Instructions (Addendum)
Patient to return in seven months.

## 2015-12-09 ENCOUNTER — Encounter: Payer: Self-pay | Admitting: Family Medicine

## 2015-12-09 ENCOUNTER — Ambulatory Visit (INDEPENDENT_AMBULATORY_CARE_PROVIDER_SITE_OTHER): Payer: 59 | Admitting: Family Medicine

## 2015-12-09 VITALS — BP 102/65 | HR 53 | Temp 97.8°F | Ht 61.5 in | Wt 198.0 lb

## 2015-12-09 DIAGNOSIS — E119 Type 2 diabetes mellitus without complications: Secondary | ICD-10-CM | POA: Diagnosis not present

## 2015-12-09 DIAGNOSIS — E785 Hyperlipidemia, unspecified: Secondary | ICD-10-CM

## 2015-12-09 DIAGNOSIS — I10 Essential (primary) hypertension: Secondary | ICD-10-CM | POA: Diagnosis not present

## 2015-12-09 LAB — BAYER DCA HB A1C WAIVED: HB A1C (BAYER DCA - WAIVED): 6.2 % (ref <7.0)

## 2015-12-09 NOTE — Progress Notes (Signed)
BP 102/65 mmHg  Pulse 53  Temp(Src) 97.8 F (36.6 C)  Ht 5' 1.5" (1.562 m)  Wt 198 lb (89.812 kg)  BMI 36.81 kg/m2  SpO2 97%   Subjective:    Patient ID: Susan Pineda, female    DOB: Dec 21, 1955, 60 y.o.   MRN: [id number]  HPI: Susan Pineda is a 60 y.o. female  Chief Complaint  Patient presents with  . Diabetes   recheck diabetes doing well no complaints from medications noted low blood sugar spells takes medications faithfully without side effects Blood pressure taking lisinopril for renal protection not blood pressure blood pressures doing well with no symptoms no orthostatic changes Getting good reports from thyroid doctor about thyroid cancer anticipates being released from her care within the year.  Relevant past medical, surgical, family and social history reviewed and updated as indicated. Interim medical history since our last visit reviewed. Allergies and medications reviewed and updated.  Review of Systems  Constitutional: Negative.   Respiratory: Negative.   Cardiovascular: Negative.     Per HPI unless specifically indicated above     Objective:    BP 102/65 mmHg  Pulse 53  Temp(Src) 97.8 F (36.6 C)  Ht 5' 1.5" (1.562 m)  Wt 198 lb (89.812 kg)  BMI 36.81 kg/m2  SpO2 97%  Wt Readings from Last 3 Encounters:  12/09/15 198 lb (89.812 kg)  11/17/15 200 lb (90.719 kg)  09/08/15 192 lb (87.091 kg)    Physical Exam  Constitutional: She is oriented to person, place, and time. She appears well-developed and well-nourished. No distress.  HENT:  Head: Normocephalic and atraumatic.  Right Ear: Hearing normal.  Left Ear: Hearing normal.  Nose: Nose normal.  Eyes: Conjunctivae and lids are normal. Right eye exhibits no discharge. Left eye exhibits no discharge. No scleral icterus.  Cardiovascular: Normal rate, regular rhythm and normal heart sounds.   Pulmonary/Chest: Effort normal and breath sounds normal. No respiratory distress.  Musculoskeletal: Normal  range of motion.  Neurological: She is alert and oriented to person, place, and time.  Skin: Skin is intact. No rash noted.  Psychiatric: She has a normal mood and affect. Her speech is normal and behavior is normal. Judgment and thought content normal. Cognition and memory are normal.    Results for orders placed or performed in visit on 09/08/15  Microscopic Examination  Result Value Ref Range   WBC, UA 0-5 0 -  5 /hpf   RBC, UA 3-10 (A) 0 -  2 /hpf   Epithelial Cells (non renal) 0-10 0 - 10 /hpf   Bacteria, UA Few None seen/Few  Bayer DCA Hb A1c Waived  Result Value Ref Range   Bayer DCA Hb A1c Waived 6.0 <7.0 %  Urinalysis, Routine w reflex microscopic (not at [facility])  Result Value Ref Range   Specific Gravity, UA 1.015 1.005 - 1.030   pH, UA 5.0 5.0 - 7.5   Color, UA Yellow Yellow   Appearance Ur Clear Clear   Leukocytes, UA Negative Negative   Protein, UA Negative Negative/Trace   Glucose, UA 2+ (A) Negative   Ketones, UA Negative Negative   RBC, UA 1+ (A) Negative   Bilirubin, UA Negative Negative   Urobilinogen, Ur 0.2 0.2 - 1.0 mg/dL   Nitrite, UA Negative Negative   Microscopic Examination See below:   CBC with Differential/Platelet  Result Value Ref Range   WBC 9.5 3.4 - 10.8 x10E3/uL   RBC 5.13 3.77 - 5.28 x10E6/uL  Hemoglobin 15.3 11.1 - 15.9 g/dL   Hematocrit 46.8 (H) 34.0 - 46.6 %   MCV 91 79 - 97 fL   MCH 29.8 26.6 - 33.0 pg   MCHC 32.7 31.5 - 35.7 g/dL   RDW 13.4 12.3 - 15.4 %   Platelets 369 150 - 379 x10E3/uL   Neutrophils 60 %   Lymphs 24 %   Monocytes 13 %   Eos 2 %   Basos 1 %   Neutrophils Absolute 5.8 1.4 - 7.0 x10E3/uL   Lymphocytes Absolute 2.2 0.7 - 3.1 x10E3/uL   Monocytes Absolute 1.2 (H) 0.1 - 0.9 x10E3/uL   EOS (ABSOLUTE) 0.2 0.0 - 0.4 x10E3/uL   Basophils Absolute 0.1 0.0 - 0.2 x10E3/uL   Immature Granulocytes 0 %   Immature Grans (Abs) 0.0 0.0 - 0.1 x10E3/uL  Comprehensive metabolic panel  Result Value Ref Range   Glucose 92 65 -  99 mg/dL   BUN 19 6 - 24 mg/dL   Creatinine, Ser 0.77 0.57 - 1.00 mg/dL   GFR calc non Af Amer 85 >59 mL/min/1.73   GFR calc Af Amer 98 >59 mL/min/1.73   BUN/Creatinine Ratio 25 (H) 9 - 23   Sodium 137 134 - 144 mmol/L   Potassium 4.9 3.5 - 5.2 mmol/L   Chloride 99 96 - 106 mmol/L   CO2 24 18 - 29 mmol/L   Calcium 9.5 8.7 - 10.2 mg/dL   Total Protein 6.5 6.0 - 8.5 g/dL   Albumin 4.2 3.5 - 5.5 g/dL   Globulin, Total 2.3 1.5 - 4.5 g/dL   Albumin/Globulin Ratio 1.8 1.1 - 2.5   Bilirubin Total 0.4 0.0 - 1.2 mg/dL   Alkaline Phosphatase 82 39 - 117 IU/L   AST 20 0 - 40 IU/L   ALT 16 0 - 32 IU/L  Lipid Panel w/o Chol/HDL Ratio  Result Value Ref Range   Cholesterol, Total 136 100 - 199 mg/dL   Triglycerides 75 0 - 149 mg/dL   HDL 44 >39 mg/dL   VLDL Cholesterol Cal 15 5 - 40 mg/dL   LDL Calculated 77 0 - 99 mg/dL  TSH  Result Value Ref Range   TSH 1.170 0.450 - 4.500 uIU/mL  Hepatitis C Antibody  Result Value Ref Range   Hep C Virus Ab <0.1 0.0 - 0.9 s/co ratio      Assessment & Plan:   Problem List Items Addressed This Visit      Cardiovascular and Mediastinum   Hypertension    The current medical regimen is effective;  continue present plan and medications.         Endocrine   Diabetes mellitus without complication ([place]) - Primary    The current medical regimen is effective;  continue present plan and medications.       Relevant Orders   Bayer DCA Hb A1c Waived     Other   Hyperlipidemia    The current medical regimen is effective;  continue present plan and medications.           Follow up plan: Return in about 3 months (around 03/10/2016) for a1c, lipids, alt, ast, bmp.

## 2015-12-09 NOTE — Assessment & Plan Note (Signed)
The current medical regimen is effective;  continue present plan and medications.  

## 2016-03-15 ENCOUNTER — Encounter: Payer: Self-pay | Admitting: Family Medicine

## 2016-03-15 ENCOUNTER — Ambulatory Visit (INDEPENDENT_AMBULATORY_CARE_PROVIDER_SITE_OTHER): Payer: 59 | Admitting: Family Medicine

## 2016-03-15 ENCOUNTER — Telehealth: Payer: Self-pay | Admitting: Family Medicine

## 2016-03-15 VITALS — BP 105/70 | HR 64 | Temp 98.1°F | Wt 205.0 lb

## 2016-03-15 DIAGNOSIS — E119 Type 2 diabetes mellitus without complications: Secondary | ICD-10-CM

## 2016-03-15 DIAGNOSIS — J019 Acute sinusitis, unspecified: Secondary | ICD-10-CM

## 2016-03-15 DIAGNOSIS — E785 Hyperlipidemia, unspecified: Secondary | ICD-10-CM

## 2016-03-15 DIAGNOSIS — I10 Essential (primary) hypertension: Secondary | ICD-10-CM

## 2016-03-15 DIAGNOSIS — E039 Hypothyroidism, unspecified: Secondary | ICD-10-CM

## 2016-03-15 LAB — BAYER DCA HB A1C WAIVED: HB A1C (BAYER DCA - WAIVED): 6.1 % (ref <7.0)

## 2016-03-15 LAB — LP+ALT+AST PICCOLO, WAIVED
ALT (SGPT) Piccolo, Waived: 19 U/L (ref 10–47)
AST (SGOT) Piccolo, Waived: 27 U/L (ref 11–38)
CHOL/HDL RATIO PICCOLO,WAIVE: 3.5 mg/dL
CHOLESTEROL PICCOLO, WAIVED: 119 mg/dL (ref <200)
HDL CHOL PICCOLO, WAIVED: 34 mg/dL — AB (ref >59)
LDL Chol Calc Piccolo Waived: 61 mg/dL (ref <100)
Triglycerides Piccolo,Waived: 119 mg/dL (ref <150)
VLDL Chol Calc Piccolo,Waive: 24 mg/dL (ref <30)

## 2016-03-15 LAB — MICROALBUMIN, URINE WAIVED
CREATININE, URINE WAIVED: 50 mg/dL (ref 10–300)
Microalb, Ur Waived: 30 mg/L — ABNORMAL HIGH (ref 0–19)

## 2016-03-15 MED ORDER — BENZONATATE 100 MG PO CAPS: 100.0000 mg | ORAL_CAPSULE | Freq: Two times a day (BID) | ORAL | 0 refills | Status: DC | PRN

## 2016-03-15 MED ORDER — HYDROCOD POLST-CPM POLST ER 10-8 MG/5ML PO SUER: 2.5000 mL | Freq: Two times a day (BID) | ORAL | 0 refills | Status: DC | PRN

## 2016-03-15 MED ORDER — AMOXICILLIN 875 MG PO TABS
875.0000 mg | ORAL_TABLET | Freq: Two times a day (BID) | ORAL | 0 refills | Status: DC
Start: 2016-03-15 — End: 2016-03-21

## 2016-03-15 NOTE — Assessment & Plan Note (Signed)
Will stay off Invokanna observe blood sugars if staying well will stay off medications discuss weight loss diet nutrition exercise

## 2016-03-15 NOTE — Telephone Encounter (Signed)
They do not have Tussinex in stock, pt requesting something else rather than going to different pharmacy.  Please call [facility].

## 2016-03-15 NOTE — Progress Notes (Signed)
BP 105/70 (BP Location: Left Arm, Patient Position: Sitting, Cuff Size: Normal)   Pulse 64   Temp 98.1 F (36.7 C)   Wt 205 lb (93 kg) Comment: with shoes  SpO2 97%   BMI 38.11 kg/m    Subjective:    Patient ID: Susan Pineda, female    DOB: December 10, 1955, 60 y.o.   MRN: [id number]  HPI: Susan Pineda is a 60 y.o. female  Chief Complaint  Patient presents with  . Diabetes  . Hypertension  . Hyperlipidemia   Patient follow-up diabetes doing well had low blood sugar spells. The Invokanna and blood sugars been doing well. Stopped Invokanna over 3 weeks ago. Blood pressure doing well no complaints from medications Cholesterol doing well no complaints from medications taking all meds faithfully Patient also with marked sinus pressure congestion drainage cough productive with stress incontinence issues associated with cough.  Relevant past medical, surgical, family and social history reviewed and updated as indicated. Interim medical history since our last visit reviewed. Allergies and medications reviewed and updated.  Review of Systems  Constitutional: Negative.   Respiratory: Positive for cough. Negative for wheezing and stridor.   Cardiovascular: Negative.     Per HPI unless specifically indicated above     Objective:    BP 105/70 (BP Location: Left Arm, Patient Position: Sitting, Cuff Size: Normal)   Pulse 64   Temp 98.1 F (36.7 C)   Wt 205 lb (93 kg) Comment: with shoes  SpO2 97%   BMI 38.11 kg/m   Wt Readings from Last 3 Encounters:  03/15/16 205 lb (93 kg)  12/09/15 198 lb (89.8 kg)  11/17/15 200 lb (90.7 kg)    Physical Exam  Constitutional: She is oriented to person, place, and time. She appears well-developed and well-nourished. No distress.  HENT:  Head: Normocephalic and atraumatic.  Right Ear: Hearing normal.  Left Ear: Hearing normal.  Nose: Nose normal.  Left ear bullous change  Eyes: Conjunctivae and lids are normal. Right eye exhibits no  discharge. Left eye exhibits no discharge. No scleral icterus.  Cardiovascular: Normal rate and normal heart sounds.   Pulmonary/Chest: Effort normal and breath sounds normal. No respiratory distress.  Musculoskeletal: Normal range of motion.  Neurological: She is alert and oriented to person, place, and time.  Skin: Skin is intact. No rash noted.  Psychiatric: She has a normal mood and affect. Her speech is normal and behavior is normal. Judgment and thought content normal. Cognition and memory are normal.    Results for orders placed or performed in visit on 12/09/15  Bayer DCA Hb A1c Waived  Result Value Ref Range   Bayer DCA Hb A1c Waived 6.2 <7.0 %      Assessment & Plan:   Problem List Items Addressed This Visit      Cardiovascular and Mediastinum   Hypertension    The current medical regimen is effective;  continue present plan and medications.       Relevant Orders   Bayer DCA Hb A1c Waived   Microalbumin, Urine Waived   LP+ALT+AST Piccolo, Waived   Basic metabolic panel     Endocrine   Diabetes mellitus without complication ([place]) - Primary    Will stay off Invokanna observe blood sugars if staying well will stay off medications discuss weight loss diet nutrition exercise      Relevant Orders   Bayer DCA Hb A1c Waived   Microalbumin, Urine [address] [place]  Basic metabolic panel   Hypothyroidism    Discussed thyroid will continue current meds has follow-up with thyroid doctor later next month        Other   Hyperlipidemia    The current medical regimen is effective;  continue present plan and medications.       Relevant Orders   Bayer DCA Hb A1c Waived   Microalbumin, Urine Waived   LP+ALT+AST Piccolo, Waived   Basic metabolic panel    Other Visit Diagnoses    Acute sinusitis, recurrence not specified, unspecified location       Discussed sinusitis care and treatment will give Amoxil and Tussionex for cough   Relevant  Medications   amoxicillin (AMOXIL) 875 MG tablet   chlorpheniramine-HYDROcodone (TUSSIONEX PENNKINETIC ER) 10-8 MG/5ML SUER       Follow up plan: Return in about 3 months (around 06/14/2016) for Hemoglobin A1c.

## 2016-03-15 NOTE — Assessment & Plan Note (Signed)
The current medical regimen is effective;  continue present plan and medications.  

## 2016-03-15 NOTE — Assessment & Plan Note (Signed)
Discussed thyroid will continue current meds has follow-up with thyroid doctor later next month

## 2016-03-16 ENCOUNTER — Encounter: Payer: Self-pay | Admitting: Family Medicine

## 2016-03-16 LAB — BASIC METABOLIC PANEL
BUN/Creatinine Ratio: 19 (ref 9–23)
BUN: 14 mg/dL (ref 6–24)
CALCIUM: 9.3 mg/dL (ref 8.7–10.2)
CO2: 23 mmol/L (ref 18–29)
CREATININE: 0.74 mg/dL (ref 0.57–1.00)
Chloride: 100 mmol/L (ref 96–106)
GFR, EST AFRICAN AMERICAN: 103 mL/min/{1.73_m2} (ref >59)
GFR, EST NON AFRICAN AMERICAN: 89 mL/min/{1.73_m2} (ref >59)
Glucose: 84 mg/dL (ref 65–99)
Potassium: 4.7 mmol/L (ref 3.5–5.2)
SODIUM: 139 mmol/L (ref 134–144)

## 2016-03-21 ENCOUNTER — Telehealth: Payer: Self-pay | Admitting: Family Medicine

## 2016-03-21 MED ORDER — SULFAMETHOXAZOLE-TRIMETHOPRIM 800-160 MG PO TABS: 1.0000 | ORAL_TABLET | Freq: Two times a day (BID) | ORAL | 0 refills | Status: DC

## 2016-03-21 MED ORDER — DM-GUAIFENESIN ER 30-600 MG PO TB12: 1.0000 | ORAL_TABLET | Freq: Two times a day (BID) | ORAL | 1 refills | Status: DC

## 2016-03-21 NOTE — Telephone Encounter (Signed)
Pt called stated she was seen last week was given two prescriptions. Pt stated these are not working. Would like to know if something else can be sent in. Pharm is [facility] in [place]. Thanks.   Pt does not want Z-pac

## 2016-03-21 NOTE — Telephone Encounter (Signed)
Which prescriptions and for what condition?

## 2016-06-14 ENCOUNTER — Ambulatory Visit: Payer: 59 | Admitting: Family Medicine

## 2016-06-20 ENCOUNTER — Ambulatory Visit: Payer: 59 | Admitting: General Surgery

## 2016-06-30 ENCOUNTER — Telehealth: Payer: Self-pay | Admitting: *Deleted

## 2016-06-30 NOTE — Telephone Encounter (Signed)
Patient had missed 06-20-16 postoperative follow up appointment with MD because she is caring for her daughter who had sugery. She states she is doing well with no problems. She appreciates all of Dr Dwyane Luo care.The patient is aware to call back for any questions or concerns.

## 2016-08-06 ENCOUNTER — Other Ambulatory Visit: Payer: Self-pay | Admitting: Family Medicine

## 2016-08-06 DIAGNOSIS — E119 Type 2 diabetes mellitus without complications: Secondary | ICD-10-CM

## 2016-08-06 DIAGNOSIS — E785 Hyperlipidemia, unspecified: Secondary | ICD-10-CM

## 2016-08-06 DIAGNOSIS — I10 Essential (primary) hypertension: Secondary | ICD-10-CM

## 2016-08-08 NOTE — Telephone Encounter (Signed)
Last OV: 03/15/16 Next OV: 09/21/16   Lab Results  Component Value Date   CHOL 119 03/15/2016   HDL 44 09/08/2015   LDLCALC 77 09/08/2015   TRIG 119 03/15/2016   Lab Results  Component Value Date   CREATININE 0.74 03/15/2016   BUN 14 03/15/2016   NA 139 03/15/2016   K 4.7 03/15/2016   CL 100 03/15/2016   CO2 23 03/15/2016     BMP Latest Ref Rng & Units 03/15/2016 09/08/2015 05/27/2015  Glucose 65 - 99 mg/dL 84 92 70  BUN 6 - 24 mg/dL 14 19 16   Creatinine 0.57 - 1.00 mg/dL 0.74 0.77 0.70  BUN/Creat Ratio 9 - 23 19 25(H) -  Sodium 134 - 144 mmol/L 139 137 138  Potassium 3.5 - 5.2 mmol/L 4.7 4.9 4.2  Chloride 96 - 106 mmol/L 100 99 104  CO2 18 - 29 mmol/L 23 24 26   Calcium 8.7 - 10.2 mg/dL 9.3 9.5 8.9

## 2016-08-18 ENCOUNTER — Other Ambulatory Visit: Payer: Self-pay | Admitting: Family Medicine

## 2016-08-18 DIAGNOSIS — Z1231 Encounter for screening mammogram for malignant neoplasm of breast: Secondary | ICD-10-CM

## 2016-08-24 LAB — HM DIABETES EYE EXAM

## 2016-09-12 ENCOUNTER — Ambulatory Visit: Payer: Self-pay | Admitting: General Surgery

## 2016-09-13 ENCOUNTER — Ambulatory Visit
Admission: RE | Admit: 2016-09-13 | Discharge: 2016-09-13 | Disposition: A | Discharge status: Home / Self Care | Payer: 59 | Source: Ambulatory Visit | Attending: Family Medicine | Admitting: Family Medicine

## 2016-09-13 DIAGNOSIS — Z1231 Encounter for screening mammogram for malignant neoplasm of breast: Secondary | ICD-10-CM | POA: Insufficient documentation

## 2016-09-21 ENCOUNTER — Encounter: Payer: Self-pay | Admitting: Family Medicine

## 2016-09-21 ENCOUNTER — Ambulatory Visit (INDEPENDENT_AMBULATORY_CARE_PROVIDER_SITE_OTHER): Payer: 59 | Admitting: Family Medicine

## 2016-09-21 VITALS — BP 101/66 | HR 73 | Ht 61.42 in | Wt 200.0 lb

## 2016-09-21 DIAGNOSIS — E7800 Pure hypercholesterolemia, unspecified: Secondary | ICD-10-CM

## 2016-09-21 DIAGNOSIS — E039 Hypothyroidism, unspecified: Secondary | ICD-10-CM

## 2016-09-21 DIAGNOSIS — Z1329 Encounter for screening for other suspected endocrine disorder: Secondary | ICD-10-CM | POA: Diagnosis not present

## 2016-09-21 DIAGNOSIS — Z0000 Encounter for general adult medical examination without abnormal findings: Secondary | ICD-10-CM | POA: Diagnosis not present

## 2016-09-21 DIAGNOSIS — Z90710 Acquired absence of both cervix and uterus: Secondary | ICD-10-CM | POA: Diagnosis not present

## 2016-09-21 DIAGNOSIS — E119 Type 2 diabetes mellitus without complications: Secondary | ICD-10-CM | POA: Diagnosis not present

## 2016-09-21 DIAGNOSIS — Z124 Encounter for screening for malignant neoplasm of cervix: Secondary | ICD-10-CM

## 2016-09-21 DIAGNOSIS — Z13220 Encounter for screening for lipoid disorders: Secondary | ICD-10-CM | POA: Diagnosis not present

## 2016-09-21 DIAGNOSIS — I10 Essential (primary) hypertension: Secondary | ICD-10-CM | POA: Diagnosis not present

## 2016-09-21 LAB — URINALYSIS, ROUTINE W REFLEX MICROSCOPIC
Bilirubin, UA: NEGATIVE
GLUCOSE, UA: NEGATIVE
Nitrite, UA: NEGATIVE
PH UA: 6 (ref 5.0–7.5)
PROTEIN UA: NEGATIVE
Specific Gravity, UA: <1.005 — ABNORMAL LOW (ref 1.005–1.030)
Urobilinogen, Ur: 0.2 mg/dL (ref 0.2–1.0)

## 2016-09-21 LAB — MICROALBUMIN, URINE WAIVED
CREATININE, URINE WAIVED: 100 mg/dL (ref 10–300)
MICROALB, UR WAIVED: 10 mg/L (ref 0–19)

## 2016-09-21 LAB — MICROSCOPIC EXAMINATION: BACTERIA UA: NONE SEEN

## 2016-09-21 LAB — HEMOGLOBIN A1C
Est. average glucose Bld gHb Est-mCnc: 140 mg/dL
Hgb A1c MFr Bld: 6.5 % — ABNORMAL HIGH (ref 4.8–5.6)

## 2016-09-21 MED ORDER — LEVOTHYROXINE SODIUM 100 MCG PO TABS: 100.0000 ug | ORAL_TABLET | Freq: Every day | ORAL | 4 refills | Status: DC

## 2016-09-21 MED ORDER — ONETOUCH DELICA LANCETS 33G MISC
12 refills | Status: DC
Start: 2016-09-21 — End: 2019-01-02

## 2016-09-21 MED ORDER — METFORMIN HCL 500 MG PO TABS: 500.0000 mg | ORAL_TABLET | Freq: Two times a day (BID) | ORAL | 4 refills | Status: DC

## 2016-09-21 MED ORDER — GLUCOSE BLOOD VI STRP: ORAL_STRIP | 12 refills | Status: DC

## 2016-09-21 MED ORDER — PRAVASTATIN SODIUM 40 MG PO TABS: 40.0000 mg | ORAL_TABLET | Freq: Every day | ORAL | 4 refills | Status: DC

## 2016-09-21 MED ORDER — LISINOPRIL 5 MG PO TABS: 5.0000 mg | ORAL_TABLET | Freq: Every morning | ORAL | 4 refills | Status: DC

## 2016-09-21 NOTE — Progress Notes (Signed)
BP 101/66   Pulse 73   Ht 5' 1.42" (1.56 m)   Wt 200 lb (90.7 kg)   SpO2 97%   BMI 37.28 kg/m    Subjective:    Patient ID: Susan Pineda, female    DOB: Dec 22, 1955, 61 y.o.   MRN: [id number]  HPI: Susan Pineda is a 61 y.o. female  Chief Complaint  Patient presents with  . Annual Exam  Patient all in all doing well. Taking lisinopril primarily for renal protection with no issues good blood pressure. Thyroid taking without problems no thyroid symptoms. Same with cholesterol doing well with no complaints taking medicines faithfully. Stopped Invokanna as blood sugars were getting too low only taking metformin 500 twice a day without problems.  Relevant past medical, surgical, family and social history reviewed and updated as indicated. Interim medical history since our last visit reviewed. Allergies and medications reviewed and updated.  Review of Systems  Constitutional: Negative.   HENT: Negative.   Eyes: Negative.   Respiratory: Negative.   Cardiovascular: Negative.   Gastrointestinal: Negative.   Endocrine: Negative.   Genitourinary: Negative.   Musculoskeletal: Negative.   Skin: Negative.   Allergic/Immunologic: Negative.   Neurological: Negative.   Hematological: Negative.   Psychiatric/Behavioral: Negative.     Per HPI unless specifically indicated above     Objective:    BP 101/66   Pulse 73   Ht 5' 1.42" (1.56 m)   Wt 200 lb (90.7 kg)   SpO2 97%   BMI 37.28 kg/m   Wt Readings from Last 3 Encounters:  09/21/16 200 lb (90.7 kg)  03/15/16 205 lb (93 kg)  12/09/15 198 lb (89.8 kg)    Physical Exam  Constitutional: She is oriented to person, place, and time. She appears well-developed and well-nourished.  HENT:  Head: Normocephalic and atraumatic.  Right Ear: External ear normal.  Left Ear: External ear normal.  Nose: Nose normal.  Mouth/Throat: Oropharynx is clear and moist.  Eyes: Conjunctivae and EOM are normal. Pupils are equal, round, and  reactive to light.  Neck: Normal range of motion. Neck supple. Carotid bruit is not present.  Cardiovascular: Normal rate, regular rhythm and normal heart sounds.   No murmur heard. Pulmonary/Chest: Effort normal and breath sounds normal. She exhibits no mass. Right breast exhibits no mass, no skin change and no tenderness. Left breast exhibits no mass, no skin change and no tenderness. Breasts are symmetrical.  Abdominal: Soft. Bowel sounds are normal. There is no hepatosplenomegaly.  Musculoskeletal: Normal range of motion.  Neurological: She is alert and oriented to person, place, and time.  Skin: No rash noted.  Psychiatric: She has a normal mood and affect. Her behavior is normal. Judgment and thought content normal.    Results for orders placed or performed in visit on 08/30/16  HM DIABETES EYE EXAM  Result Value Ref Range   HM Diabetic Eye Exam No Retinopathy No Retinopathy      Assessment & Plan:   Problem List Items Addressed This Visit      Cardiovascular and Mediastinum   Hypertension - Primary    The current medical regimen is effective;  continue present plan and medications.       Relevant Medications   lisinopril (PRINIVIL,ZESTRIL) 5 MG tablet   pravastatin (PRAVACHOL) 40 MG tablet   Other Relevant Orders   Microalbumin, Urine Waived   CBC with Differential/Platelet   Comprehensive metabolic panel   Urinalysis, Routine w reflex microscopic  Hemoglobin A1c     Endocrine   Diabetes mellitus without complication ([facility])    The current medical regimen is effective;  continue present plan and medications.       Relevant Medications   lisinopril (PRINIVIL,ZESTRIL) 5 MG tablet   metFORMIN (GLUCOPHAGE) 500 MG tablet   pravastatin (PRAVACHOL) 40 MG tablet   Other Relevant Orders   Microalbumin, Urine Waived   CBC with Differential/Platelet   Comprehensive metabolic panel   Urinalysis, Routine w reflex microscopic   Hemoglobin A1c   Hypothyroidism    The  current medical regimen is effective;  continue present plan and medications.       Relevant Medications   levothyroxine (SYNTHROID) 100 MCG tablet     Other   Hyperlipidemia    The current medical regimen is effective;  continue present plan and medications.       Relevant Medications   lisinopril (PRINIVIL,ZESTRIL) 5 MG tablet   pravastatin (PRAVACHOL) 40 MG tablet   S/P hysterectomy    Other Visit Diagnoses    Annual physical exam       Relevant Orders   Microalbumin, Urine Waived   CBC with Differential/Platelet   Comprehensive metabolic panel   Lipid panel   TSH   Urinalysis, Routine w reflex microscopic   Hemoglobin A1c   Screening cholesterol level       Relevant Orders   Lipid panel   Thyroid disorder screen       Relevant Orders   TSH   Cervical cancer screening           Follow up plan: Return in about 6 months (around 03/24/2017) for Hemoglobin A1c, BMP,  Lipids, ALT, AST.

## 2016-09-21 NOTE — Assessment & Plan Note (Signed)
The current medical regimen is effective;  continue present plan and medications.  

## 2016-09-22 ENCOUNTER — Telehealth: Payer: Self-pay | Admitting: Family Medicine

## 2016-09-22 DIAGNOSIS — D582 Other hemoglobinopathies: Secondary | ICD-10-CM

## 2016-09-22 LAB — COMPREHENSIVE METABOLIC PANEL
A/G RATIO: 1.9 (ref 1.2–2.2)
ALT: 18 IU/L (ref 0–32)
AST: 20 IU/L (ref 0–40)
Albumin: 4.5 g/dL (ref 3.5–5.5)
Alkaline Phosphatase: 76 IU/L (ref 39–117)
BUN/Creatinine Ratio: 18 (ref 9–23)
BUN: 13 mg/dL (ref 6–24)
Bilirubin Total: 0.3 mg/dL (ref 0.0–1.2)
CALCIUM: 9.7 mg/dL (ref 8.7–10.2)
CHLORIDE: 98 mmol/L (ref 96–106)
CO2: 25 mmol/L (ref 18–29)
Creatinine, Ser: 0.71 mg/dL (ref 0.57–1.00)
GFR calc Af Amer: 108 mL/min/{1.73_m2} (ref >59)
GFR, EST NON AFRICAN AMERICAN: 94 mL/min/{1.73_m2} (ref >59)
GLUCOSE: 83 mg/dL (ref 65–99)
Globulin, Total: 2.4 g/dL (ref 1.5–4.5)
POTASSIUM: 4.4 mmol/L (ref 3.5–5.2)
Sodium: 139 mmol/L (ref 134–144)
Total Protein: 6.9 g/dL (ref 6.0–8.5)

## 2016-09-22 LAB — CBC WITH DIFFERENTIAL/PLATELET
BASOS ABS: 0.1 10*3/uL (ref 0.0–0.2)
BASOS: 1 %
EOS (ABSOLUTE): 0.2 10*3/uL (ref 0.0–0.4)
Eos: 2 %
Hematocrit: 46.8 % — ABNORMAL HIGH (ref 34.0–46.6)
Hemoglobin: 15.6 g/dL (ref 11.1–15.9)
IMMATURE GRANS (ABS): 0 10*3/uL (ref 0.0–0.1)
Immature Granulocytes: 0 %
LYMPHS ABS: 2.9 10*3/uL (ref 0.7–3.1)
Lymphs: 26 %
MCH: 29.9 pg (ref 26.6–33.0)
MCHC: 33.3 g/dL (ref 31.5–35.7)
MCV: 90 fL (ref 79–97)
MONOS ABS: 1.4 10*3/uL — AB (ref 0.1–0.9)
Monocytes: 13 %
NEUTROS ABS: 6.4 10*3/uL (ref 1.4–7.0)
Neutrophils: 58 %
Platelets: 422 10*3/uL — ABNORMAL HIGH (ref 150–379)
RBC: 5.21 x10E6/uL (ref 3.77–5.28)
RDW: 13.1 % (ref 12.3–15.4)
WBC: 10.9 10*3/uL — ABNORMAL HIGH (ref 3.4–10.8)

## 2016-09-22 LAB — TSH: TSH: 1.6 u[IU]/mL (ref 0.450–4.500)

## 2016-09-22 LAB — LIPID PANEL
Chol/HDL Ratio: 3.3 ratio units (ref 0.0–4.4)
Cholesterol, Total: 122 mg/dL (ref 100–199)
HDL: 37 mg/dL — AB (ref >39)
LDL Calculated: 61 mg/dL (ref 0–99)
TRIGLYCERIDES: 118 mg/dL (ref 0–149)
VLDL CHOLESTEROL CAL: 24 mg/dL (ref 5–40)

## 2016-09-22 NOTE — Telephone Encounter (Signed)
Phone call Discussed with patient slight elevation of hemoglobin platelets and WBC. We will have patient come back in about a month to recheck CBC

## 2016-11-02 ENCOUNTER — Encounter: Payer: Self-pay | Admitting: Family Medicine

## 2016-11-02 ENCOUNTER — Ambulatory Visit (INDEPENDENT_AMBULATORY_CARE_PROVIDER_SITE_OTHER): Payer: 59 | Admitting: Family Medicine

## 2016-11-02 VITALS — BP 101/63 | HR 56 | Temp 98.8°F | Wt 208.0 lb

## 2016-11-02 DIAGNOSIS — J019 Acute sinusitis, unspecified: Secondary | ICD-10-CM

## 2016-11-02 DIAGNOSIS — H66002 Acute suppurative otitis media without spontaneous rupture of ear drum, left ear: Secondary | ICD-10-CM | POA: Diagnosis not present

## 2016-11-02 MED ORDER — BENZONATATE 200 MG PO CAPS: 200.0000 mg | ORAL_CAPSULE | Freq: Three times a day (TID) | ORAL | 0 refills | Status: DC | PRN

## 2016-11-02 MED ORDER — LEVOFLOXACIN 750 MG PO TABS: 750.0000 mg | ORAL_TABLET | Freq: Every day | ORAL | 0 refills | Status: DC

## 2016-11-02 MED ORDER — FEXOFENADINE-PSEUDOEPHED ER 60-120 MG PO TB12: 1.0000 | ORAL_TABLET | Freq: Two times a day (BID) | ORAL | 1 refills | Status: DC

## 2016-11-02 NOTE — Assessment & Plan Note (Signed)
Sinusitis with otitis media failure with Augmentin will put on Levaquin patient doesn't do sudden physical activity with increased risk of tendon rupture. Discuss 90 pot and Mucinex Allegra-D over-the-counter medications and if not getting better and continued symptoms will refer to ear nose and throat.

## 2016-11-02 NOTE — Progress Notes (Signed)
BP 101/63   Pulse (!) 56   Temp 98.8 F (37.1 C) (Oral)   Wt 208 lb (94.3 kg)   SpO2 98%   BMI 38.77 kg/m    Subjective:    Patient ID: Susan Pineda, female    DOB: 05-31-1956, 61 y.o.   MRN: [id number]  HPI: Susan Pineda is a 61 y.o. female  Chief Complaint  Patient presents with  . Sore Throat  . Ear Pain    10/13/16 - called onlineMD 10 day supply of amoxicillin   Patient completed a course of Augmentin and Tessalon Perles originally got somewhat better but now left ear is stopped up cant hear and is somewhat painful lot of sinus drainage congestion even to the point of teeth hurting.  Relevant past medical, surgical, family and social history reviewed and updated as indicated. Interim medical history since our last visit reviewed. Allergies and medications reviewed and updated.  Review of Systems  Constitutional: Negative.   HENT: Positive for congestion, ear pain, hearing loss, postnasal drip, rhinorrhea, sinus pain, sinus pressure and sneezing.   Respiratory: Positive for cough and shortness of breath.   Cardiovascular: Negative.     Per HPI unless specifically indicated above     Objective:    BP 101/63   Pulse (!) 56   Temp 98.8 F (37.1 C) (Oral)   Wt 208 lb (94.3 kg)   SpO2 98%   BMI 38.77 kg/m   Wt Readings from Last 3 Encounters:  11/02/16 208 lb (94.3 kg)  09/21/16 200 lb (90.7 kg)  03/15/16 205 lb (93 kg)    Physical Exam  Constitutional: She is oriented to person, place, and time. She appears well-developed and well-nourished.  HENT:  Head: Normocephalic and atraumatic.  Right Ear: External ear normal.  Mouth/Throat: Oropharyngeal exudate present.  Left ear with changes of otitis media  Eyes: Conjunctivae and EOM are normal.  Neck: Normal range of motion.  Cardiovascular: Normal rate, regular rhythm and normal heart sounds.   Pulmonary/Chest: Effort normal. No respiratory distress. She has no wheezes. She has no rales.  Musculoskeletal:  Normal range of motion.  Lymphadenopathy:    She has no cervical adenopathy.  Neurological: She is alert and oriented to person, place, and time.  Skin: No erythema.  Psychiatric: She has a normal mood and affect. Her behavior is normal. Judgment and thought content normal.    Results for orders placed or performed in visit on 09/21/16  Microscopic Examination  Result Value Ref Range   WBC, UA 0-5 0 - 5 /hpf   RBC, UA 0-2 0 - 2 /hpf   Epithelial Cells (non renal) CANCELED    Bacteria, UA None seen None seen/Few  Microalbumin, Urine Waived  Result Value Ref Range   Microalb, Ur Waived 10 0 - 19 mg/L   Creatinine, Urine Waived 100 10 - 300 mg/dL   Microalb/Creat Ratio <30 <30 mg/g  CBC with Differential/Platelet  Result Value Ref Range   WBC 10.9 (H) 3.4 - 10.8 x10E3/uL   RBC 5.21 3.77 - 5.28 x10E6/uL   Hemoglobin 15.6 11.1 - 15.9 g/dL   Hematocrit 46.8 (H) 34.0 - 46.6 %   MCV 90 79 - 97 fL   MCH 29.9 26.6 - 33.0 pg   MCHC 33.3 31.5 - 35.7 g/dL   RDW 13.1 12.3 - 15.4 %   Platelets 422 (H) 150 - 379 x10E3/uL   Neutrophils 58 Not Estab. %   Lymphs 26 Not Estab. %  Monocytes 13 Not Estab. %   Eos 2 Not Estab. %   Basos 1 Not Estab. %   Neutrophils Absolute 6.4 1.4 - 7.0 x10E3/uL   Lymphocytes Absolute 2.9 0.7 - 3.1 x10E3/uL   Monocytes Absolute 1.4 (H) 0.1 - 0.9 x10E3/uL   EOS (ABSOLUTE) 0.2 0.0 - 0.4 x10E3/uL   Basophils Absolute 0.1 0.0 - 0.2 x10E3/uL   Immature Granulocytes 0 Not Estab. %   Immature Grans (Abs) 0.0 0.0 - 0.1 x10E3/uL  Comprehensive metabolic panel  Result Value Ref Range   Glucose 83 65 - 99 mg/dL   BUN 13 6 - 24 mg/dL   Creatinine, Ser 0.71 0.57 - 1.00 mg/dL   GFR calc non Af Amer 94 >59 mL/min/1.73   GFR calc Af Amer 108 >59 mL/min/1.73   BUN/Creatinine Ratio 18 9 - 23   Sodium 139 134 - 144 mmol/L   Potassium 4.4 3.5 - 5.2 mmol/L   Chloride 98 96 - 106 mmol/L   CO2 25 18 - 29 mmol/L   Calcium 9.7 8.7 - 10.2 mg/dL   Total Protein 6.9 6.0 - 8.5  g/dL   Albumin 4.5 3.5 - 5.5 g/dL   Globulin, Total 2.4 1.5 - 4.5 g/dL   Albumin/Globulin Ratio 1.9 1.2 - 2.2   Bilirubin Total 0.3 0.0 - 1.2 mg/dL   Alkaline Phosphatase 76 39 - 117 IU/L   AST 20 0 - 40 IU/L   ALT 18 0 - 32 IU/L  Lipid panel  Result Value Ref Range   Cholesterol, Total 122 100 - 199 mg/dL   Triglycerides 118 0 - 149 mg/dL   HDL 37 (L) >39 mg/dL   VLDL Cholesterol Cal 24 5 - 40 mg/dL   LDL Calculated 61 0 - 99 mg/dL   Chol/HDL Ratio 3.3 0.0 - 4.4 ratio units  TSH  Result Value Ref Range   TSH 1.600 0.450 - 4.500 uIU/mL  Urinalysis, Routine w reflex microscopic  Result Value Ref Range   Specific Gravity, UA <1.005 (L) 1.005 - 1.030   pH, UA 6.0 5.0 - 7.5   Color, UA Yellow Yellow   Appearance Ur Clear Clear   Leukocytes, UA Trace (A) Negative   Protein, UA Negative Negative/Trace   Glucose, UA Negative Negative   Ketones, UA 1+ (A) Negative   RBC, UA 1+ (A) Negative   Bilirubin, UA Negative Negative   Urobilinogen, Ur 0.2 0.2 - 1.0 mg/dL   Nitrite, UA Negative Negative   Microscopic Examination See below:   Hemoglobin A1c  Result Value Ref Range   Hgb A1c MFr Bld 6.5 (H) 4.8 - 5.6 %   Est. average glucose Bld gHb Est-mCnc 140 mg/dL      Assessment & Plan:   Problem List Items Addressed This Visit      Respiratory   Acute sinusitis - Primary    Sinusitis with otitis media failure with Augmentin will put on Levaquin patient doesn't do sudden physical activity with increased risk of tendon rupture. Discuss 90 pot and Mucinex Allegra-D over-the-counter medications and if not getting better and continued symptoms will refer to ear nose and throat.      Relevant Medications   levofloxacin (LEVAQUIN) 750 MG tablet   benzonatate (TESSALON) 200 MG capsule   fexofenadine-pseudoephedrine (ALLEGRA-D) 60-120 MG 12 hr tablet     Nervous and Auditory   Acute suppurative otitis media of left ear without spontaneous rupture of tympanic membrane   Relevant  Medications   levofloxacin (LEVAQUIN) 750  MG tablet       Follow up plan: Return for As scheduled.

## 2017-03-07 ENCOUNTER — Encounter: Payer: Self-pay | Admitting: Family Medicine

## 2017-03-07 ENCOUNTER — Ambulatory Visit (INDEPENDENT_AMBULATORY_CARE_PROVIDER_SITE_OTHER): Payer: 59 | Admitting: Family Medicine

## 2017-03-07 VITALS — BP 108/72 | HR 68 | Temp 99.2°F | Wt 193.0 lb

## 2017-03-07 DIAGNOSIS — L237 Allergic contact dermatitis due to plants, except food: Secondary | ICD-10-CM

## 2017-03-07 MED ORDER — PREDNISONE 10 MG PO TABS: ORAL_TABLET | ORAL | 0 refills | Status: DC

## 2017-03-07 MED ORDER — CLOBETASOL PROPIONATE 0.05 % EX OINT: 1.0000 "application " | TOPICAL_OINTMENT | Freq: Two times a day (BID) | CUTANEOUS | 0 refills | Status: DC

## 2017-03-07 NOTE — Progress Notes (Signed)
   BP 108/72   Pulse 68   Temp 99.2 F (37.3 C)   Wt 193 lb (87.5 kg)   SpO2 96%   BMI 35.97 kg/m    Subjective:    Patient ID: Susan Pineda, female    DOB: 1956-05-27, 61 y.o.   MRN: [id number]  HPI: Susan Pineda is a 61 y.o. female  Chief Complaint  Patient presents with  . Rash    started last night, both arms, hair line, sides, groins. Itching. No changes in soaps/detergants. Did pull weeds yesterday.   Patient presents with red patches of intensely pruritic rash on b/l UEs, groin, hair line, sides, and back that started late last night. Has been using hydrocortisone cream with no relief. Notes she was pulling weeds yesterday, no other changes in hygiene products, meds, or foods.   Relevant past medical, surgical, family and social history reviewed and updated as indicated. Interim medical history since our last visit reviewed. Allergies and medications reviewed and updated.  Review of Systems  Constitutional: Negative.   HENT: Negative.   Respiratory: Negative.   Cardiovascular: Negative.   Gastrointestinal: Negative.   Genitourinary: Positive for urgency.  Musculoskeletal: Negative.   Skin: Positive for rash.  Neurological: Negative.   Psychiatric/Behavioral: Negative.    Per HPI unless specifically indicated above     Objective:    BP 108/72   Pulse 68   Temp 99.2 F (37.3 C)   Wt 193 lb (87.5 kg)   SpO2 96%   BMI 35.97 kg/m   Wt Readings from Last 3 Encounters:  03/07/17 193 lb (87.5 kg)  11/02/16 208 lb (94.3 kg)  09/21/16 200 lb (90.7 kg)    Physical Exam  Constitutional: She is oriented to person, place, and time. She appears well-developed and well-nourished. No distress.  HENT:  Head: Atraumatic.  Eyes: Pupils are equal, round, and reactive to light. Conjunctivae are normal. No scleral icterus.  Neck: Normal range of motion. Neck supple.  Cardiovascular: Normal rate and normal heart sounds.   Pulmonary/Chest: Effort normal and breath sounds  normal. No respiratory distress.  Musculoskeletal: Normal range of motion.  Neurological: She is alert and oriented to person, place, and time.  Skin: Skin is warm and dry. Rash (Diffuse maculopapular rash on inside of elbows, sides, back) noted.  Psychiatric: She has a normal mood and affect. Her behavior is normal.  Nursing note and vitals reviewed.     Assessment & Plan:   Problem List Items Addressed This Visit    None    Visit Diagnoses    Allergic contact dermatitis due to plants, except food    -  Primary   Will do extended prednisone course, benadryl prn, clobetasol ointment      Follow up plan: Return if symptoms worsen or fail to improve.

## 2017-03-08 ENCOUNTER — Ambulatory Visit: Payer: 59 | Admitting: Family Medicine

## 2017-03-09 NOTE — Patient Instructions (Signed)
Follow up as needed

## 2017-03-27 ENCOUNTER — Encounter: Payer: Self-pay | Admitting: Family Medicine

## 2017-03-27 ENCOUNTER — Ambulatory Visit (INDEPENDENT_AMBULATORY_CARE_PROVIDER_SITE_OTHER): Payer: 59 | Admitting: Family Medicine

## 2017-03-27 VITALS — BP 120/76 | HR 63 | Wt 186.0 lb

## 2017-03-27 DIAGNOSIS — E119 Type 2 diabetes mellitus without complications: Secondary | ICD-10-CM

## 2017-03-27 DIAGNOSIS — I10 Essential (primary) hypertension: Secondary | ICD-10-CM | POA: Diagnosis not present

## 2017-03-27 DIAGNOSIS — E7800 Pure hypercholesterolemia, unspecified: Secondary | ICD-10-CM | POA: Diagnosis not present

## 2017-03-27 DIAGNOSIS — Z23 Encounter for immunization: Secondary | ICD-10-CM | POA: Diagnosis not present

## 2017-03-27 LAB — LP+ALT+AST PICCOLO, WAIVED
ALT (SGPT) PICCOLO, WAIVED: 27 U/L (ref 10–47)
AST (SGOT) PICCOLO, WAIVED: 27 U/L (ref 11–38)
CHOLESTEROL PICCOLO, WAIVED: 162 mg/dL (ref <200)
Chol/HDL Ratio Piccolo,Waive: 3.8 mg/dL
HDL CHOL PICCOLO, WAIVED: 43 mg/dL — AB (ref >59)
LDL Chol Calc Piccolo Waived: 98 mg/dL (ref <100)
TRIGLYCERIDES PICCOLO,WAIVED: 117 mg/dL (ref <150)
VLDL Chol Calc Piccolo,Waive: 23 mg/dL (ref <30)

## 2017-03-27 LAB — BAYER DCA HB A1C WAIVED: HB A1C (BAYER DCA - WAIVED): 6.1 % (ref <7.0)

## 2017-03-27 NOTE — Assessment & Plan Note (Signed)
The current medical regimen is effective;  continue present plan and medications.  

## 2017-03-27 NOTE — Progress Notes (Signed)
BP 120/76   Pulse 63   Wt 186 lb (84.4 kg)   SpO2 97%   BMI 34.67 kg/m    Subjective:    Patient ID: Susan Pineda, female    DOB: 04/08/1956, 61 y.o.   MRN: [id number]  HPI: Susan Pineda is a 61 y.o. female  Chief Complaint  Patient presents with  . Follow-up  . Diabetes  . Hypertension   Patient resolves hives turns out is from chemical exposure sprayed on weeds. Eyes gone with prednisone. Blood pressure doing well no complaints from medications. Diabetes noted low blood sugar spells or issues. Weights down significantly. Some of this is intentional but some due to very heavy stress she is been going to her.  Relevant past medical, surgical, family and social history reviewed and updated as indicated. Interim medical history since our last visit reviewed. Allergies and medications reviewed and updated.  Review of Systems  Constitutional: Negative.   Respiratory: Negative.   Cardiovascular: Negative.     Per HPI unless specifically indicated above     Objective:    BP 120/76   Pulse 63   Wt 186 lb (84.4 kg)   SpO2 97%   BMI 34.67 kg/m   Wt Readings from Last 3 Encounters:  03/27/17 186 lb (84.4 kg)  03/07/17 193 lb (87.5 kg)  11/02/16 208 lb (94.3 kg)    Physical Exam  Constitutional: She is oriented to person, place, and time. She appears well-developed and well-nourished.  HENT:  Head: Normocephalic and atraumatic.  Eyes: Conjunctivae and EOM are normal.  Neck: Normal range of motion.  Cardiovascular: Normal rate, regular rhythm and normal heart sounds.   Pulmonary/Chest: Effort normal and breath sounds normal.  Musculoskeletal: Normal range of motion.  Neurological: She is alert and oriented to person, place, and time.  Skin: No erythema.  Psychiatric: She has a normal mood and affect. Her behavior is normal. Judgment and thought content normal.    Results for orders placed or performed in visit on 09/21/16  Microscopic Examination  Result Value  Ref Range   WBC, UA 0-5 0 - 5 /hpf   RBC, UA 0-2 0 - 2 /hpf   Epithelial Cells (non renal) CANCELED    Bacteria, UA None seen None seen/Few  Microalbumin, Urine Waived  Result Value Ref Range   Microalb, Ur Waived 10 0 - 19 mg/L   Creatinine, Urine Waived 100 10 - 300 mg/dL   Microalb/Creat Ratio <30 <30 mg/g  CBC with Differential/Platelet  Result Value Ref Range   WBC 10.9 (H) 3.4 - 10.8 x10E3/uL   RBC 5.21 3.77 - 5.28 x10E6/uL   Hemoglobin 15.6 11.1 - 15.9 g/dL   Hematocrit 46.8 (H) 34.0 - 46.6 %   MCV 90 79 - 97 fL   MCH 29.9 26.6 - 33.0 pg   MCHC 33.3 31.5 - 35.7 g/dL   RDW 13.1 12.3 - 15.4 %   Platelets 422 (H) 150 - 379 x10E3/uL   Neutrophils 58 Not Estab. %   Lymphs 26 Not Estab. %   Monocytes 13 Not Estab. %   Eos 2 Not Estab. %   Basos 1 Not Estab. %   Neutrophils Absolute 6.4 1.4 - 7.0 x10E3/uL   Lymphocytes Absolute 2.9 0.7 - 3.1 x10E3/uL   Monocytes Absolute 1.4 (H) 0.1 - 0.9 x10E3/uL   EOS (ABSOLUTE) 0.2 0.0 - 0.4 x10E3/uL   Basophils Absolute 0.1 0.0 - 0.2 x10E3/uL   Immature Granulocytes 0 Not Estab. %  Immature Grans (Abs) 0.0 0.0 - 0.1 x10E3/uL  Comprehensive metabolic panel  Result Value Ref Range   Glucose 83 65 - 99 mg/dL   BUN 13 6 - 24 mg/dL   Creatinine, Ser 0.71 0.57 - 1.00 mg/dL   GFR calc non Af Amer 94 >59 mL/min/1.73   GFR calc Af Amer 108 >59 mL/min/1.73   BUN/Creatinine Ratio 18 9 - 23   Sodium 139 134 - 144 mmol/L   Potassium 4.4 3.5 - 5.2 mmol/L   Chloride 98 96 - 106 mmol/L   CO2 25 18 - 29 mmol/L   Calcium 9.7 8.7 - 10.2 mg/dL   Total Protein 6.9 6.0 - 8.5 g/dL   Albumin 4.5 3.5 - 5.5 g/dL   Globulin, Total 2.4 1.5 - 4.5 g/dL   Albumin/Globulin Ratio 1.9 1.2 - 2.2   Bilirubin Total 0.3 0.0 - 1.2 mg/dL   Alkaline Phosphatase 76 39 - 117 IU/L   AST 20 0 - 40 IU/L   ALT 18 0 - 32 IU/L  Lipid panel  Result Value Ref Range   Cholesterol, Total 122 100 - 199 mg/dL   Triglycerides 118 0 - 149 mg/dL   HDL 37 (L) >39 mg/dL   VLDL  Cholesterol Cal 24 5 - 40 mg/dL   LDL Calculated 61 0 - 99 mg/dL   Chol/HDL Ratio 3.3 0.0 - 4.4 ratio units  TSH  Result Value Ref Range   TSH 1.600 0.450 - 4.500 uIU/mL  Urinalysis, Routine w reflex microscopic  Result Value Ref Range   Specific Gravity, UA <1.005 (L) 1.005 - 1.030   pH, UA 6.0 5.0 - 7.5   Color, UA Yellow Yellow   Appearance Ur Clear Clear   Leukocytes, UA Trace (A) Negative   Protein, UA Negative Negative/Trace   Glucose, UA Negative Negative   Ketones, UA 1+ (A) Negative   RBC, UA 1+ (A) Negative   Bilirubin, UA Negative Negative   Urobilinogen, Ur 0.2 0.2 - 1.0 mg/dL   Nitrite, UA Negative Negative   Microscopic Examination See below:   Hemoglobin A1c  Result Value Ref Range   Hgb A1c MFr Bld 6.5 (H) 4.8 - 5.6 %   Est. average glucose Bld gHb Est-mCnc 140 mg/dL      Assessment & Plan:   Problem List Items Addressed This Visit      Cardiovascular and Mediastinum   Hypertension - Primary    The current medical regimen is effective;  continue present plan and medications.       Relevant Orders   Basic metabolic panel   Bayer DCA Hb A1c Waived   LP+ALT+AST Piccolo, [place]     Endocrine   Diabetes mellitus without complication ([place])    The current medical regimen is effective;  continue present plan and medications.       Relevant Orders   Basic metabolic panel   Bayer DCA Hb A1c Waived   LP+ALT+AST Piccolo, Waived     Other   Hyperlipidemia    The current medical regimen is effective;  continue present plan and medications. LDL elevated but we'll blamed that on taking prednisone as patient had other very good LDL readings including a screening one done through [facility] a month ago.      Relevant Orders   Basic metabolic panel   Bayer DCA Hb A1c Waived   LP+ALT+AST Piccolo, [place]    Other Visit Diagnoses    Needs flu shot  Relevant Orders   Flu Vaccine QUAD 6+ mos PF IM (Fluarix Quad PF) (Completed)       Follow up  plan: Return in about 6 months (around 09/24/2017) for Physical Exam, Hemoglobin A1c.

## 2017-03-27 NOTE — Patient Instructions (Signed)

## 2017-03-27 NOTE — Assessment & Plan Note (Addendum)
The current medical regimen is effective;  continue present plan and medications. LDL elevated but we'll blamed that on taking prednisone as patient had other very good LDL readings including a screening one done through [facility] a month ago.

## 2017-03-28 ENCOUNTER — Encounter: Payer: Self-pay | Admitting: Family Medicine

## 2017-03-28 LAB — BASIC METABOLIC PANEL
BUN / CREAT RATIO: 13 (ref 12–28)
BUN: 10 mg/dL (ref 8–27)
CO2: 23 mmol/L (ref 20–29)
CREATININE: 0.78 mg/dL (ref 0.57–1.00)
Calcium: 10.1 mg/dL (ref 8.7–10.3)
Chloride: 99 mmol/L (ref 96–106)
GFR calc Af Amer: 96 mL/min/{1.73_m2} (ref >59)
GFR, EST NON AFRICAN AMERICAN: 83 mL/min/{1.73_m2} (ref >59)
Glucose: 95 mg/dL (ref 65–99)
Potassium: 4.7 mmol/L (ref 3.5–5.2)
SODIUM: 138 mmol/L (ref 134–144)

## 2017-08-29 ENCOUNTER — Other Ambulatory Visit: Payer: Self-pay | Admitting: Family Medicine

## 2017-08-29 DIAGNOSIS — Z1231 Encounter for screening mammogram for malignant neoplasm of breast: Secondary | ICD-10-CM

## 2017-08-30 LAB — HM DIABETES EYE EXAM

## 2017-09-15 ENCOUNTER — Ambulatory Visit
Admission: RE | Admit: 2017-09-15 | Discharge: 2017-09-15 | Disposition: A | Discharge status: Home / Self Care | Payer: Managed Care, Other (non HMO) | Source: Ambulatory Visit | Attending: Family Medicine | Admitting: Family Medicine

## 2017-09-15 DIAGNOSIS — Z1231 Encounter for screening mammogram for malignant neoplasm of breast: Secondary | ICD-10-CM | POA: Insufficient documentation

## 2017-09-19 ENCOUNTER — Encounter: Payer: Self-pay | Admitting: Family Medicine

## 2017-09-19 ENCOUNTER — Ambulatory Visit (INDEPENDENT_AMBULATORY_CARE_PROVIDER_SITE_OTHER): Payer: Managed Care, Other (non HMO) | Admitting: Family Medicine

## 2017-09-19 VITALS — BP 102/69 | HR 60 | Ht 61.0 in | Wt 189.0 lb

## 2017-09-19 DIAGNOSIS — E7800 Pure hypercholesterolemia, unspecified: Secondary | ICD-10-CM | POA: Diagnosis not present

## 2017-09-19 DIAGNOSIS — C73 Malignant neoplasm of thyroid gland: Secondary | ICD-10-CM | POA: Diagnosis not present

## 2017-09-19 DIAGNOSIS — E119 Type 2 diabetes mellitus without complications: Secondary | ICD-10-CM

## 2017-09-19 DIAGNOSIS — E039 Hypothyroidism, unspecified: Secondary | ICD-10-CM

## 2017-09-19 DIAGNOSIS — I10 Essential (primary) hypertension: Secondary | ICD-10-CM | POA: Diagnosis not present

## 2017-09-19 LAB — BAYER DCA HB A1C WAIVED: HB A1C (BAYER DCA - WAIVED): 5.5 % (ref <7.0)

## 2017-09-19 MED ORDER — LISINOPRIL 5 MG PO TABS: 5.0000 mg | ORAL_TABLET | Freq: Every morning | ORAL | 4 refills | Status: DC

## 2017-09-19 MED ORDER — METFORMIN HCL 500 MG PO TABS: 500.0000 mg | ORAL_TABLET | Freq: Two times a day (BID) | ORAL | 4 refills | Status: DC

## 2017-09-19 MED ORDER — MUPIROCIN 2 % EX OINT: 1.0000 "application " | TOPICAL_OINTMENT | Freq: Two times a day (BID) | CUTANEOUS | 0 refills | Status: DC

## 2017-09-19 MED ORDER — PRAVASTATIN SODIUM 40 MG PO TABS: 40.0000 mg | ORAL_TABLET | Freq: Every day | ORAL | 4 refills | Status: DC

## 2017-09-19 NOTE — Assessment & Plan Note (Signed)
The current medical regimen is effective;  continue present plan and medications.  

## 2017-09-19 NOTE — Assessment & Plan Note (Addendum)
Diabetes doing well with no low blood sugar spells.  Has responded well with weight loss Patient's fasting blood sugar this morning was 80 so will stop her evening metformin.

## 2017-09-19 NOTE — Assessment & Plan Note (Signed)
Resolved and followed by endocrinology with thyroid medication

## 2017-09-19 NOTE — Progress Notes (Signed)
BP 102/69   Pulse 60   Ht 5\' 1"  (1.549 m)   Wt 189 lb (85.7 kg)   SpO2 99%   BMI 35.71 kg/m    Subjective:    Patient ID: Susan Pineda, female    DOB: 1956/06/18, 62 y.o.   MRN: [id number]  HPI: Susan Pineda is a 62 y.o. female  Chief Complaint  Patient presents with  . Follow-up  . Hypertension  Patient also do her physical. Has noticed over the last 3 weeks or so redness dry irritation of her nose which then will pick or rub and stays red. Also has some changes on her cheeks Stress from loss of her brother is abating somewhat but still present. No low blood sugar spells is been taking metformin without problems and good control. Blood pressure also doing well along with cholesterol. Followed by endocrinology for her thyroid issues which are stable.  Relevant past medical, surgical, family and social history reviewed and updated as indicated. Interim medical history since our last visit reviewed. Allergies and medications reviewed and updated.  Review of Systems  Constitutional: Negative.   HENT: Negative.   Eyes: Negative.   Respiratory: Negative.   Cardiovascular: Negative.   Gastrointestinal: Negative.   Endocrine: Negative.   Genitourinary: Negative.   Musculoskeletal: Negative.   Skin: Negative.   Allergic/Immunologic: Negative.   Neurological: Negative.   Hematological: Negative.   Psychiatric/Behavioral: Negative.     Per HPI unless specifically indicated above     Objective:    BP 102/69   Pulse 60   Ht 5\' 1"  (1.549 m)   Wt 189 lb (85.7 kg)   SpO2 99%   BMI 35.71 kg/m   Wt Readings from Last 3 Encounters:  09/19/17 189 lb (85.7 kg)  03/27/17 186 lb (84.4 kg)  03/07/17 193 lb (87.5 kg)    Physical Exam  Constitutional: She is oriented to person, place, and time. She appears well-developed and well-nourished.  HENT:  Head: Normocephalic and atraumatic.  Right Ear: External ear normal.  Left Ear: External ear normal.  Nose: Nose normal.    Mouth/Throat: Oropharynx is clear and moist.  Eyes: Conjunctivae and EOM are normal. Pupils are equal, round, and reactive to light.  Neck: Normal range of motion. Neck supple. Carotid bruit is not present.  Cardiovascular: Normal rate, regular rhythm and normal heart sounds.  No murmur heard. Pulmonary/Chest: Effort normal and breath sounds normal. She exhibits no mass. Right breast exhibits no mass, no skin change and no tenderness. Left breast exhibits no mass, no skin change and no tenderness. Breasts are symmetrical.  Abdominal: Soft. Bowel sounds are normal. There is no hepatosplenomegaly.  Musculoskeletal: Normal range of motion.  Neurological: She is alert and oriented to person, place, and time.  Skin: No rash noted.  Psychiatric: She has a normal mood and affect. Her behavior is normal. Judgment and thought content normal.    Results for orders placed or performed in visit on 94/49/67  Basic metabolic panel  Result Value Ref Range   Glucose 95 65 - 99 mg/dL   BUN 10 8 - 27 mg/dL   Creatinine, Ser 0.78 0.57 - 1.00 mg/dL   GFR calc non Af Amer 83 >59 mL/min/1.73   GFR calc Af Amer 96 >59 mL/min/1.73   BUN/Creatinine Ratio 13 12 - 28   Sodium 138 134 - 144 mmol/L   Potassium 4.7 3.5 - 5.2 mmol/L   Chloride 99 96 - 106 mmol/L  CO2 23 20 - 29 mmol/L   Calcium 10.1 8.7 - 10.3 mg/dL  Bayer DCA Hb A1c Waived  Result Value Ref Range   Bayer DCA Hb A1c Waived 6.1 <7.0 %  LP+ALT+AST Piccolo, Waived  Result Value Ref Range   ALT (SGPT) Piccolo, Waived 27 10 - 47 U/L   AST (SGOT) Piccolo, Waived 27 11 - 38 U/L   Cholesterol Piccolo, Waived 162 <200 mg/dL   HDL Chol Piccolo, Waived 43 (L) >59 mg/dL   Triglycerides Piccolo,Waived 117 <150 mg/dL   Chol/HDL Ratio Piccolo,Waive 3.8 mg/dL   LDL Chol Calc Piccolo Waived 98 <100 mg/dL   VLDL Chol Calc Piccolo,Waive 23 <30 mg/dL      Assessment & Plan:   Problem List Items Addressed This Visit      Cardiovascular and Mediastinum    Hypertension    The current medical regimen is effective;  continue present plan and medications.       Relevant Medications   pravastatin (PRAVACHOL) 40 MG tablet   lisinopril (PRINIVIL,ZESTRIL) 5 MG tablet   Other Relevant Orders   Bayer DCA Hb A1c Waived     Endocrine   Diabetes mellitus without complication ([place]) - Primary    Diabetes doing well with no low blood sugar spells.  Has responded well with weight loss Patient's fasting blood sugar this morning was 80 so will stop her evening metformin.      Relevant Medications   pravastatin (PRAVACHOL) 40 MG tablet   metFORMIN (GLUCOPHAGE) 500 MG tablet   lisinopril (PRINIVIL,ZESTRIL) 5 MG tablet   Other Relevant Orders   Bayer DCA Hb A1c Waived   Hypothyroidism    The current medical regimen is effective;  continue present plan and medications.       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       Relevant Medications   pravastatin (PRAVACHOL) 40 MG tablet   lisinopril (PRINIVIL,ZESTRIL) 5 MG tablet   Other Relevant Orders   Bayer DCA Hb A1c Waived       Follow up plan: Return in about 6 months (around 03/22/2018) for Hemoglobin A1c, BMP,  Lipids, ALT, AST.

## 2018-02-20 ENCOUNTER — Ambulatory Visit (INDEPENDENT_AMBULATORY_CARE_PROVIDER_SITE_OTHER): Payer: Managed Care, Other (non HMO) | Admitting: Physician Assistant

## 2018-02-20 ENCOUNTER — Encounter: Payer: Self-pay | Admitting: Physician Assistant

## 2018-02-20 ENCOUNTER — Other Ambulatory Visit: Payer: Self-pay

## 2018-02-20 VITALS — BP 121/79 | HR 65 | Temp 98.5°F | Ht 61.0 in | Wt 193.1 lb

## 2018-02-20 DIAGNOSIS — L719 Rosacea, unspecified: Secondary | ICD-10-CM

## 2018-02-20 MED ORDER — METRONIDAZOLE 0.75 % EX CREA: TOPICAL_CREAM | Freq: Two times a day (BID) | CUTANEOUS | 0 refills | Status: DC

## 2018-02-20 NOTE — Patient Instructions (Signed)
Rosacea Rosacea is a long-term (chronic) condition that affects the skin of the face, including the cheeks, nose, brow, and chin. This condition can also affect the eyes. Rosacea causes blood vessels near the surface of the skin to enlarge, which results in redness. What are the causes? The cause of this condition is not known. Certain triggers can make rosacea worse, including:  Hot baths.  Exercise.  Sunlight.  Very hot or cold temperatures.  Hot or spicy foods and drinks.  Drinking alcohol.  Stress.  Taking blood pressure medicine.  Long-term use of topical steroids on the face.  What increases the risk? This condition is more likely to develop in:  People who are older than 61 years of age.  Women.  People who have light-colored skin (light complexion).  People who have a family history of rosacea.  What are the signs or symptoms? Symptoms of this condition include:  Redness of the face.  Red bumps or pimples on the face.  A red, enlarged nose.  Blushing easily.  Red lines on the skin.  Irritated or burning feeling in the eyes.  Swollen eyelids.  How is this diagnosed? This condition is diagnosed with a medical history and physical exam. How is this treated? There is no cure for this condition, but treatment can help to control your symptoms. Your health care provider may recommend that you see a skin specialist (dermatologist). Treatment may include:  Antibiotic medicines that are applied to the skin or taken as a pill.  Laser treatment to improve the appearance of the skin.  Surgery. This is rare.  Your health care provider will also recommend the best way to take care of your skin. Even after your skin improves, you will likely need to continue treatment to prevent your rosacea from coming back. Follow these instructions at home: Skin Care Take care of your skin as told by your health care provider. You may be told to do these things:  Wash  your skin gently two or more times each day.  Use mild soap.  Use a sunscreen or sunblock with SPF 30 or greater.  Use gentle cosmetics that are meant for sensitive skin.  Shave with an electric shaver instead of a blade.  Lifestyle  Try to keep track of what foods trigger this condition. Avoid any triggers. These may include: ? Spicy foods. ? Seafood. ? Cheese. ? Hot liquids. ? Nuts. ? Chocolate. ? Iodized salt.  Do not drink alcohol.  Avoid extremely cold or hot temperatures.  Try to reduce your stress. If you need help, talk with your health care provider.  When you exercise, do these things to stay cool: ? Limit your sun exposure. ? Use a fan. ? Do shorter and more frequent intervals of exercise. General instructions  Keep all follow-up visits as told by your health care provider. This is important.  Take over-the-counter and prescription medicines only as told by your health care provider.  If your eyelids are affected, apply warm compresses to them. Do this as told by your health care provider.  If you were prescribed an antibiotic medicine, apply or take it as told by your health care provider. Do not stop using the antibiotic even if your condition improves. Contact a health care provider if:  Your symptoms get worse.  Your symptoms do not improve after two months of treatment.  You have new symptoms.  You have any changes in vision or you have problems with your eyes, such as   redness or itching.  You feel depressed.  You lose your appetite.  You have trouble concentrating. This information is not intended to replace advice given to you by your health care provider. Make sure you discuss any questions you have with your health care provider. Document Released: 07/28/2004 Document Revised: 11/26/2015 Document Reviewed: 08/27/2014 Elsevier Interactive Patient Education  2018 [employer] Inc[employer]  

## 2018-02-20 NOTE — Progress Notes (Signed)
   Subjective:    Patient ID: Susan Pineda, female    DOB: 1956/02/12, 62 y.o.   MRN: [id number]  Susan Pineda is a 62 y.o. female presenting on 02/20/2018 for Rash (on face/ pt states she has been having redish, itching and burning x 3 weeeks ago. Pt states she has been using mupirocin that Dr. Jeananne Rama prescribed while back and OTC cream but nothing helped.)   HPI   Normally has red cheeks, flushes easily. Most recently had red bumps pop up. Tried mupirocin ointment which did not work. Tried prosacea which did not work. No fevers, chills, drainage from lesions.   Social History   Tobacco Use  . Smoking status: Former Smoker    Packs/day: 0.25    Years: 42.00    Pack years: 10.50    Types: Cigarettes    Last attempt to quit: 08/05/2015    Years since quitting: 2.5  . Smokeless tobacco: Never Used  Substance Use Topics  . Alcohol use: No    Alcohol/week: 0.0 standard drinks  . Drug use: No    Review of Systems Per HPI unless specifically indicated above     Objective:    BP 121/79   Pulse 65   Temp 98.5 F (36.9 C) (Oral)   Ht 5\' 1"  (1.549 m)   Wt 193 lb 1.6 oz (87.6 kg)   SpO2 95%   BMI 36.49 kg/m   Wt Readings from Last 3 Encounters:  02/20/18 193 lb 1.6 oz (87.6 kg)  09/19/17 189 lb (85.7 kg)  03/27/17 186 lb (84.4 kg)    Physical Exam  Constitutional: She is oriented to person, place, and time.  Neurological: She is alert and oriented to person, place, and time.  Skin: Skin is warm and dry. Rash noted. There is erythema.  Nasal bridge and cheeks erythematous. There are multiple erythematous papules with no drainage. Telangectasias present.   Psychiatric: She has a normal mood and affect. Her behavior is normal.   Results for orders placed or performed in visit on 09/19/17  Bayer DCA Hb A1c Waived  Result Value Ref Range   HB A1C (BAYER DCA - WAIVED) 5.5 <7.0 %      Assessment & Plan:  1. Rosacea  Will take weeks to show improvement, please apply daily.     - metroNIDAZOLE (METROCREAM) 0.75 % cream; Apply topically 2 (two) times daily. Apply thin layer twice daily to affected area.  Dispense: 45 g; Refill: 0    Follow up plan: Return if symptoms worsen or fail to improve.  Carles Collet, PA-C [facility] 02/20/2018, 9:32 AM

## 2018-04-03 ENCOUNTER — Ambulatory Visit: Payer: Managed Care, Other (non HMO) | Admitting: Family Medicine

## 2018-04-03 ENCOUNTER — Encounter: Payer: Self-pay | Admitting: Family Medicine

## 2018-04-03 VITALS — BP 109/75 | HR 64 | Temp 98.6°F | Ht 61.0 in | Wt 189.4 lb

## 2018-04-03 DIAGNOSIS — Z23 Encounter for immunization: Secondary | ICD-10-CM | POA: Diagnosis not present

## 2018-04-03 DIAGNOSIS — E119 Type 2 diabetes mellitus without complications: Secondary | ICD-10-CM

## 2018-04-03 DIAGNOSIS — E7800 Pure hypercholesterolemia, unspecified: Secondary | ICD-10-CM

## 2018-04-03 DIAGNOSIS — I10 Essential (primary) hypertension: Secondary | ICD-10-CM | POA: Diagnosis not present

## 2018-04-03 DIAGNOSIS — E785 Hyperlipidemia, unspecified: Secondary | ICD-10-CM | POA: Diagnosis not present

## 2018-04-03 DIAGNOSIS — E039 Hypothyroidism, unspecified: Secondary | ICD-10-CM

## 2018-04-03 LAB — LP+ALT+AST PICCOLO, WAIVED
ALT (SGPT) Piccolo, Waived: 19 U/L (ref 10–47)
AST (SGOT) Piccolo, Waived: 30 U/L (ref 11–38)
CHOL/HDL RATIO PICCOLO,WAIVE: 3.4 mg/dL
Cholesterol Piccolo, Waived: 137 mg/dL (ref <200)
HDL Chol Piccolo, Waived: 40 mg/dL — ABNORMAL LOW (ref >59)
LDL CHOL CALC PICCOLO WAIVED: 80 mg/dL (ref <100)
Triglycerides Piccolo,Waived: 82 mg/dL (ref <150)
VLDL Chol Calc Piccolo,Waive: 16 mg/dL (ref <30)

## 2018-04-03 LAB — BAYER DCA HB A1C WAIVED: HB A1C: 5.8 % (ref <7.0)

## 2018-04-03 MED ORDER — LISINOPRIL 5 MG PO TABS: 5.0000 mg | ORAL_TABLET | Freq: Every morning | ORAL | 3 refills | Status: DC

## 2018-04-03 MED ORDER — PRAVASTATIN SODIUM 40 MG PO TABS: 40.0000 mg | ORAL_TABLET | Freq: Every day | ORAL | 3 refills | Status: DC

## 2018-04-03 MED ORDER — METFORMIN HCL 500 MG PO TABS: 500.0000 mg | ORAL_TABLET | Freq: Two times a day (BID) | ORAL | 3 refills | Status: DC

## 2018-04-03 NOTE — Assessment & Plan Note (Signed)
The current medical regimen is effective;  continue present plan and medications.  

## 2018-04-03 NOTE — Progress Notes (Signed)
BP 109/75   Pulse 64   Temp 98.6 F (37 C) (Oral)   Ht 5\' 1"  (1.549 m)   Wt 189 lb 6.4 oz (85.9 kg)   SpO2 98%   BMI 35.79 kg/m    Subjective:    Patient ID: Susan Pineda, female    DOB: June 01, 1956, 62 y.o.   MRN: [id number]  HPI: Susan Pineda is a 62 y.o. female  Chief Complaint  Patient presents with  . Diabetes  . Hyperlipidemia  . Hypertension  Patient follow-up hypertension hypercholesterol diabetes been doing well with medications no complaints did reduce metformin last month to 500 mg once a day.  Blood sugars remained good. Cholesterol blood pressures also remain good with no problems taking medications without issues no low blood sugar spells.  Patient does cheat on her diet from time to time and has been unable to lose any weight.  Relevant past medical, surgical, family and social history reviewed and updated as indicated. Interim medical history since our last visit reviewed. Allergies and medications reviewed and updated.  Review of Systems  Constitutional: Negative.   Respiratory: Negative.   Cardiovascular: Negative.     Per HPI unless specifically indicated above     Objective:    BP 109/75   Pulse 64   Temp 98.6 F (37 C) (Oral)   Ht 5\' 1"  (1.549 m)   Wt 189 lb 6.4 oz (85.9 kg)   SpO2 98%   BMI 35.79 kg/m   Wt Readings from Last 3 Encounters:  04/03/18 189 lb 6.4 oz (85.9 kg)  02/20/18 193 lb 1.6 oz (87.6 kg)  09/19/17 189 lb (85.7 kg)    Physical Exam  Constitutional: She is oriented to person, place, and time. She appears well-developed and well-nourished.  HENT:  Head: Normocephalic and atraumatic.  Eyes: Conjunctivae and EOM are normal.  Neck: Normal range of motion.  Cardiovascular: Normal rate, regular rhythm and normal heart sounds.  Pulmonary/Chest: Effort normal and breath sounds normal.  Musculoskeletal: Normal range of motion.  Neurological: She is alert and oriented to person, place, and time.  Skin: No erythema.    Psychiatric: She has a normal mood and affect. Her behavior is normal. Judgment and thought content normal.    Results for orders placed or performed in visit on 09/19/17  Bayer DCA Hb A1c Waived  Result Value Ref Range   HB A1C (BAYER DCA - WAIVED) 5.5 <7.0 %      Assessment & Plan:   Problem List Items Addressed This Visit      Cardiovascular and Mediastinum   Hypertension - Primary    The current medical regimen is effective;  continue present plan and medications.       Relevant Medications   pravastatin (PRAVACHOL) 40 MG tablet   lisinopril (PRINIVIL,ZESTRIL) 5 MG tablet   Other Relevant Orders   Basic metabolic panel   Bayer DCA Hb A1c Waived     Endocrine   Diabetes mellitus without complication ([place])    The current medical regimen is effective;  continue present plan and medications.       Relevant Medications   pravastatin (PRAVACHOL) 40 MG tablet   metFORMIN (GLUCOPHAGE) 500 MG tablet   lisinopril (PRINIVIL,ZESTRIL) 5 MG tablet   Other Relevant Orders   Basic metabolic panel   Bayer DCA Hb A1c Waived   Hypothyroidism    The current medical regimen is effective;  continue present plan and medications.Marland Kitchenc  Other   Hyperlipidemia    The current medical regimen is effective;  continue present plan and medications.       Relevant Medications   pravastatin (PRAVACHOL) 40 MG tablet   lisinopril (PRINIVIL,ZESTRIL) 5 MG tablet   Other Relevant Orders   LP+ALT+AST Piccolo, Waived    Other Visit Diagnoses    Need for influenza vaccination       Relevant Orders   Flu Vaccine QUAD 36+ mos IM (Completed)       Follow up plan: Return in about 6 months (around 10/03/2018) for Physical Exam, Hemoglobin A1c.

## 2018-04-03 NOTE — Assessment & Plan Note (Signed)
The current medical regimen is effective;  continue present plan and medications.Marland Kitchenc

## 2018-04-04 ENCOUNTER — Encounter: Payer: Self-pay | Admitting: Family Medicine

## 2018-04-04 LAB — BASIC METABOLIC PANEL
BUN / CREAT RATIO: 17 (ref 12–28)
BUN: 13 mg/dL (ref 8–27)
CALCIUM: 9.5 mg/dL (ref 8.7–10.3)
CO2: 22 mmol/L (ref 20–29)
CREATININE: 0.78 mg/dL (ref 0.57–1.00)
Chloride: 101 mmol/L (ref 96–106)
GFR calc non Af Amer: 82 mL/min/{1.73_m2} (ref >59)
GFR, EST AFRICAN AMERICAN: 95 mL/min/{1.73_m2} (ref >59)
Glucose: 94 mg/dL (ref 65–99)
Potassium: 5.1 mmol/L (ref 3.5–5.2)
Sodium: 139 mmol/L (ref 134–144)

## 2018-09-06 ENCOUNTER — Other Ambulatory Visit: Payer: Self-pay | Admitting: Family Medicine

## 2018-09-06 DIAGNOSIS — Z1231 Encounter for screening mammogram for malignant neoplasm of breast: Secondary | ICD-10-CM

## 2018-10-09 ENCOUNTER — Encounter: Payer: Managed Care, Other (non HMO) | Admitting: Family Medicine

## 2019-01-02 ENCOUNTER — Other Ambulatory Visit: Payer: Self-pay

## 2019-01-02 ENCOUNTER — Ambulatory Visit (INDEPENDENT_AMBULATORY_CARE_PROVIDER_SITE_OTHER): Payer: Managed Care, Other (non HMO) | Admitting: Nurse Practitioner

## 2019-01-02 ENCOUNTER — Encounter: Payer: Self-pay | Admitting: Nurse Practitioner

## 2019-01-02 VITALS — BP 100/70 | HR 58 | Temp 98.5°F | Ht 61.7 in | Wt 193.6 lb

## 2019-01-02 DIAGNOSIS — I10 Essential (primary) hypertension: Secondary | ICD-10-CM

## 2019-01-02 DIAGNOSIS — E119 Type 2 diabetes mellitus without complications: Secondary | ICD-10-CM | POA: Diagnosis not present

## 2019-01-02 DIAGNOSIS — E039 Hypothyroidism, unspecified: Secondary | ICD-10-CM

## 2019-01-02 DIAGNOSIS — Z0000 Encounter for general adult medical examination without abnormal findings: Secondary | ICD-10-CM

## 2019-01-02 DIAGNOSIS — E1169 Type 2 diabetes mellitus with other specified complication: Secondary | ICD-10-CM

## 2019-01-02 DIAGNOSIS — K219 Gastro-esophageal reflux disease without esophagitis: Secondary | ICD-10-CM

## 2019-01-02 DIAGNOSIS — E785 Hyperlipidemia, unspecified: Secondary | ICD-10-CM

## 2019-01-02 LAB — MICROALBUMIN, URINE WAIVED
Creatinine, Urine Waived: 50 mg/dL (ref 10–300)
Microalb, Ur Waived: 10 mg/L (ref 0–19)

## 2019-01-02 LAB — BAYER DCA HB A1C WAIVED: HB A1C (BAYER DCA - WAIVED): 5.8 % (ref <7.0)

## 2019-01-02 MED ORDER — GLUCOSE BLOOD VI STRP: ORAL_STRIP | 12 refills | Status: DC

## 2019-01-02 MED ORDER — LISINOPRIL 5 MG PO TABS: 5.0000 mg | ORAL_TABLET | Freq: Every morning | ORAL | 3 refills | Status: DC

## 2019-01-02 MED ORDER — ONETOUCH DELICA LANCETS 33G MISC: 12 refills | Status: DC

## 2019-01-02 MED ORDER — METFORMIN HCL 500 MG PO TABS: 500.0000 mg | ORAL_TABLET | Freq: Two times a day (BID) | ORAL | 3 refills | Status: DC

## 2019-01-02 MED ORDER — PRAVASTATIN SODIUM 40 MG PO TABS: 40.0000 mg | ORAL_TABLET | Freq: Every day | ORAL | 3 refills | Status: DC

## 2019-01-02 NOTE — Assessment & Plan Note (Signed)
Continue current medication regimen and collaboration with Dr. Gabriel Carina.  TSH today.

## 2019-01-02 NOTE — Assessment & Plan Note (Signed)
Chronic, stable with BP at goal.  Continue current medication regimen, Lisinopril for kidney protection in T2DM.

## 2019-01-02 NOTE — Progress Notes (Signed)
BP 100/70   Pulse (!) 58   Temp 98.5 F (36.9 C) (Oral)   Ht 5' 1.7" (1.567 m)   Wt 193 lb 9.6 oz (87.8 kg)   SpO2 97%   BMI 35.76 kg/m    Subjective:    Patient ID: Susan Pineda, female    DOB: 02-18-1956, 63 y.o.   MRN: [id number]  HPI: Susan Pineda is a 63 y.o. female presenting on 01/02/2019 for comprehensive medical examination. Current medical complaints include:none  She currently lives with: self Menopausal Symptoms: no   DIABETES Continues on Metformin 500 MG twice a day.   Hypoglycemic episodes:no Polydipsia/polyuria: no Visual disturbance: no Chest pain: no Paresthesias: no Glucose Monitoring: yes  Accucheck frequency: rarely  Fasting glucose: 100's  Post prandial: 130-135  Evening:  Before meals: Taking Insulin?: no  Long acting insulin:  Short acting insulin: Blood Pressure Monitoring: not checking Retinal Examination: Not up to Date Foot Exam: Up to Date Pneumovax: Up to Date Influenza: Up to Date Aspirin: yes   HYPOTHYROIDISM Continues on Levothyroxine 88 MCG.  Dr. Gabriel Carina follows and she sees her this month. Thyroid control status:stable Satisfied with current treatment? yes Medication side effects: no Medication compliance: good compliance Etiology of hypothyroidism:  Recent dose adjustment:no Fatigue: no Cold intolerance: no Heat intolerance: no Weight gain: no Weight loss: no Constipation: no Diarrhea/loose stools: no Palpitations: no Lower extremity edema: no Anxiety/depressed mood: no  Depression Screen done today and results listed below:  Depression screen [facility] 2/9 01/02/2019 09/19/2017 03/27/2017 09/21/2016 06/08/2015  Decreased Interest 0 0 0 0 0  Down, Depressed, Hopeless 0 0 0 0 0  PHQ - 2 Score 0 0 0 0 0  Altered sleeping 0 0 - - -  Tired, decreased energy 0 0 - - -  Change in appetite 0 0 - - -  Feeling bad or failure about yourself  0 0 - - -  Trouble concentrating 0 0 - - -  Moving slowly or fidgety/restless 0 0 - - -   Suicidal thoughts 0 0 - - -  PHQ-9 Score 0 0 - - -  Difficult doing work/chores Not difficult at all - - - -    The patient does not have a history of falls. I did not complete a risk assessment for falls. A plan of care for falls was not documented.   Past Medical History:  Past Medical History:  Diagnosis Date  . Anemia    h/o  . Cancer ([facility]) 2012   THYROID CA  . Chronic kidney disease    KIDNEY STONES  . Diabetes mellitus without complication ([facility])   . Diverticulitis   . GERD (gastroesophageal reflux disease)   . Hyperlipidemia   . Hypertension   . Hypothyroidism     Surgical History:  Past Surgical History:  Procedure Laterality Date  . ABDOMINAL HYSTERECTOMY  1999  . HERNIA REPAIR    . HERNIA REPAIR  2012, 2013   Dr Rochel Brome  . LITHOTRIPSY     X2  . PARATHYROIDECTOMY Right   . 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 SUBMANDIBULAR GLAND EXCISION Right 12/18/2014   Procedure: Right submandibular gland and subligual gland excision;  Surgeon: Carloyn Manner, MD;  Location: [facility] ORS;  Service: ENT;  Laterality: Right;  . THYROIDECTOMY    . VENTRAL HERNIA REPAIR N/A 06/02/2015   Procedure: HERNIA REPAIR VENTRAL ADULT, laparoscopic;  Surgeon: Robert Bellow, MD;  Location: [facility] ORS;  Service:  General;  Laterality: N/A;    Medications:  Current Outpatient Medications on File Prior to Visit  Medication Sig  . aspirin EC 81 MG tablet Take 81 mg by mouth daily.  . Cinnamon 500 MG TABS Take 1 tablet by mouth daily.  . Cranberry 1000 MG CAPS Take 1 capsule by mouth daily.  . Famotidine-Ca Carb-Mag Hydrox (PEPCID COMPLETE PO) Take 1 tablet by mouth as needed.  Marland Kitchen levothyroxine (SYNTHROID, LEVOTHROID) 88 MCG tablet Take 88 mcg by mouth daily.   No current facility-administered medications on file prior to visit.     Allergies:  No Known Allergies  Social History:  Social History   Socioeconomic History  . Marital status: Divorced     Spouse name: Not on file  . Number of children: Not on file  . Years of education: Not on file  . Highest education level: Not on file  Occupational History  . Not on file  Social Needs  . Financial resource strain: Not on file  . Food insecurity    Worry: Not on file    Inability: Not on file  . Transportation needs    Medical: Not on file    Non-medical: Not on file  Tobacco Use  . Smoking status: Former Smoker    Packs/day: 0.25    Years: 42.00    Pack years: 10.50    Types: Cigarettes    Quit date: 08/05/2015    Years since quitting: 3.4  . Smokeless tobacco: Never Used  Substance and Sexual Activity  . Alcohol use: No    Alcohol/week: 0.0 standard drinks  . Drug use: No  . Sexual activity: Not on file  Lifestyle  . Physical activity    Days per week: Not on file    Minutes per session: Not on file  . Stress: Not on file  Relationships  . Social Herbalist on phone: Not on file    Gets together: Not on file    Attends religious service: Not on file    Active member of club or organization: Not on file    Attends meetings of clubs or organizations: Not on file    Relationship status: Not on file  . Intimate partner violence    Fear of current or ex partner: Not on file    Emotionally abused: Not on file    Physically abused: Not on file    Forced sexual activity: Not on file  Other Topics Concern  . Not on file  Social History Narrative  . Not on file   Social History   Tobacco Use  Smoking Status Former Smoker  . Packs/day: 0.25  . Years: 42.00  . Pack years: 10.50  . Types: Cigarettes  . Quit date: 08/05/2015  . Years since quitting: 3.4  Smokeless Tobacco Never Used   Social History   Substance and Sexual Activity  Alcohol Use No  . Alcohol/week: 0.0 standard drinks    Family History:  Family History  Problem Relation Age of Onset  . Cancer Mother   . Cancer Father   . Breast cancer Neg Hx     Past medical history, surgical  history, medications, allergies, family history and social history reviewed with patient today and changes made to appropriate areas of the chart.   Review of Systems - negative All other ROS negative except what is listed above and in the HPI.      Objective:    BP 100/70  Pulse (!) 58   Temp 98.5 F (36.9 C) (Oral)   Ht 5' 1.7" (1.567 m)   Wt 193 lb 9.6 oz (87.8 kg)   SpO2 97%   BMI 35.76 kg/m   Wt Readings from Last 3 Encounters:  01/02/19 193 lb 9.6 oz (87.8 kg)  04/03/18 189 lb 6.4 oz (85.9 kg)  02/20/18 193 lb 1.6 oz (87.6 kg)    Physical Exam Vitals signs and nursing note reviewed.  Constitutional:      General: She is awake.     Appearance: She is well-developed.  HENT:     Head: Normocephalic.     Right Ear: Hearing, tympanic membrane, ear canal and external ear normal.     Left Ear: Hearing, tympanic membrane, ear canal and external ear normal.     Nose: Nose normal.     Mouth/Throat:     Mouth: Mucous membranes are moist.     Pharynx: Oropharynx is clear.  Eyes:     General: Lids are normal.        Right eye: No discharge.        Left eye: No discharge.     Conjunctiva/sclera: Conjunctivae normal.     Pupils: Pupils are equal, round, and reactive to light.  Neck:     Musculoskeletal: Normal range of motion and neck supple.     Vascular: No carotid bruit.  Cardiovascular:     Rate and Rhythm: Normal rate and regular rhythm.     Heart sounds: Normal heart sounds. No murmur. No gallop.   Pulmonary:     Effort: Pulmonary effort is normal. No accessory muscle usage or respiratory distress.     Breath sounds: Normal breath sounds.  Chest:     Breasts:        Right: Normal. No swelling, bleeding, inverted nipple, mass, nipple discharge, skin change or tenderness.        Left: Normal. No swelling, bleeding, inverted nipple, mass, nipple discharge, skin change or tenderness.  Abdominal:     General: Bowel sounds are normal.     Palpations: Abdomen is soft.  There is no hepatomegaly or splenomegaly.  Musculoskeletal:     Right lower leg: No edema.     Left lower leg: No edema.  Lymphadenopathy:     Cervical: No cervical adenopathy.     Upper Body:     Right upper body: No supraclavicular, axillary or pectoral adenopathy.     Left upper body: No supraclavicular, axillary or pectoral adenopathy.  Skin:    General: Skin is warm and dry.  Neurological:     Mental Status: She is alert and oriented to person, place, and time.     Cranial Nerves: Cranial nerves are intact.     Coordination: Coordination is intact.     Gait: Gait is intact.     Deep Tendon Reflexes: Reflexes are normal and symmetric.  Psychiatric:        Attention and Perception: Attention normal.        Mood and Affect: Mood normal.        Speech: Speech normal.        Behavior: Behavior normal. Behavior is cooperative.        Thought Content: Thought content normal.        Cognition and Memory: Cognition normal.        Judgment: Judgment normal.     Results for orders placed or performed in visit on 21/22/48  Basic metabolic panel  Result Value Ref Range   Glucose 94 65 - 99 mg/dL   BUN 13 8 - 27 mg/dL   Creatinine, Ser 0.78 0.57 - 1.00 mg/dL   GFR calc non Af Amer 82 >59 mL/min/1.73   GFR calc Af Amer 95 >59 mL/min/1.73   BUN/Creatinine Ratio 17 12 - 28   Sodium 139 134 - 144 mmol/L   Potassium 5.1 3.5 - 5.2 mmol/L   Chloride 101 96 - 106 mmol/L   CO2 22 20 - 29 mmol/L   Calcium 9.5 8.7 - 10.3 mg/dL  Bayer DCA Hb A1c Waived  Result Value Ref Range   HB A1C (BAYER DCA - WAIVED) 5.8 <7.0 %  LP+ALT+AST Piccolo, Waived  Result Value Ref Range   ALT (SGPT) Piccolo, Waived 19 10 - 47 U/L   AST (SGOT) Piccolo, Waived 30 11 - 38 U/L   Cholesterol Piccolo, Waived 137 <200 mg/dL   HDL Chol Piccolo, Waived 40 (L) >59 mg/dL   Triglycerides Piccolo,Waived 82 <150 mg/dL   Chol/HDL Ratio Piccolo,Waive 3.4 mg/dL   LDL Chol Calc Piccolo Waived 80 <100 mg/dL   VLDL Chol  Calc Piccolo,Waive 16 <30 mg/dL      Assessment & Plan:   Problem List Items Addressed This Visit      Cardiovascular and Mediastinum   Hypertension    Chronic, stable with BP at goal.  Continue current medication regimen, Lisinopril for kidney protection in T2DM.        Relevant Medications   pravastatin (PRAVACHOL) 40 MG tablet   lisinopril (ZESTRIL) 5 MG tablet   Other Relevant Orders   CBC with Differential/Platelet   Comprehensive metabolic panel     Endocrine   Diabetes mellitus without complication (HCC)    Chronic, stable.  A1C 5.8% today.  Continue current medication regimen and blood sugar checks at home.  Return in 6 months.      Relevant Medications   pravastatin (PRAVACHOL) 40 MG tablet   metFORMIN (GLUCOPHAGE) 500 MG tablet   lisinopril (ZESTRIL) 5 MG tablet   Other Relevant Orders   Bayer DCA Hb A1c Waived   Microalbumin, Urine Waived   Hyperlipidemia associated with type 2 diabetes mellitus (HCC)    Chronic, stable.  Continue current medication regimen.  Lipid panel today and CMP.      Relevant Medications   pravastatin (PRAVACHOL) 40 MG tablet   metFORMIN (GLUCOPHAGE) 500 MG tablet   lisinopril (ZESTRIL) 5 MG tablet   Other Relevant Orders   Comprehensive metabolic panel   Lipid Panel w/o Chol/HDL Ratio   Hypothyroidism    Continue current medication regimen and collaboration with Dr. Gabriel Carina.  TSH today.      Relevant Orders   Thyroid Panel With TSH    Other Visit Diagnoses    Encounter for annual physical exam    -  Primary   Gastroesophageal reflux disease without esophagitis       Continue occasional Pepcid, check mag level today.   Relevant Orders   Magnesium       Follow up plan: Return in about 6 months (around 07/05/2019) for T2DM, HTN/HLD.   LABORATORY TESTING:  - Pap smear: up to date  IMMUNIZATIONS:   - Tdap: Tetanus vaccination status reviewed: last tetanus booster within 10 years. - Influenza: Up to date - Pneumovax: Up to  date - Prevnar: Not applicable - HPV: Not applicable - Zostavax vaccine: Refused  SCREENING: -Mammogram: Up to date  - Colonoscopy: Up to date  -  Bone Density: Not applicable  -Hearing Test: Not applicable  -Spirometry: Not applicable   PATIENT COUNSELING:   Advised to take 1 mg of folate supplement per day if capable of pregnancy.   Sexuality: Discussed sexually transmitted diseases, partner selection, use of condoms, avoidance of unintended pregnancy  and contraceptive alternatives.   Advised to avoid cigarette smoking.  I discussed with the patient that most people either abstain from alcohol or drink within safe limits (<=14/week and <=4 drinks/occasion for males, <=7/weeks and <= 3 drinks/occasion for females) and that the risk for alcohol disorders and other health effects rises proportionally with the number of drinks per week and how often a drinker exceeds daily limits.  Discussed cessation/primary prevention of drug use and availability of treatment for abuse.   Diet: Encouraged to adjust caloric intake to maintain  or achieve ideal body weight, to reduce intake of dietary saturated fat and total fat, to limit sodium intake by avoiding high sodium foods and not adding table salt, and to maintain adequate dietary potassium and calcium preferably from fresh fruits, vegetables, and low-fat dairy products.    stressed the importance of regular exercise  Injury prevention: Discussed safety belts, safety helmets, smoke detector, smoking near bedding or upholstery.   Dental health: Discussed importance of regular tooth brushing, flossing, and dental visits.    NEXT PREVENTATIVE PHYSICAL DUE IN 1 YEAR. Return in about 6 months (around 07/05/2019) for T2DM, HTN/HLD.

## 2019-01-02 NOTE — Patient Instructions (Signed)
Carbohydrate Counting for Diabetes Mellitus, Adult  Carbohydrate counting is a method of keeping track of how many carbohydrates you eat. Eating carbohydrates naturally increases the amount of sugar (glucose) in the blood. Counting how many carbohydrates you eat helps keep your blood glucose within normal limits, which helps you manage your diabetes (diabetes mellitus). It is important to know how many carbohydrates you can safely have in each meal. This is different for every person. A diet and nutrition specialist (registered dietitian) can help you make a meal plan and calculate how many carbohydrates you should have at each meal and snack. Carbohydrates are found in the following foods:  Grains, such as breads and cereals.  Dried beans and soy products.  Starchy vegetables, such as potatoes, peas, and corn.  Fruit and fruit juices.  Milk and yogurt.  Sweets and snack foods, such as cake, cookies, candy, chips, and soft drinks. How do I count carbohydrates? There are two ways to count carbohydrates in food. You can use either of the methods or a combination of both. Reading "Nutrition Facts" on packaged food The "Nutrition Facts" list is included on the labels of almost all packaged foods and beverages in the [place] It includes:  The serving size.  Information about nutrients in each serving, including the grams (g) of carbohydrate per serving. To use the "Nutrition Facts":  Decide how many servings you will have.  Multiply the number of servings by the number of carbohydrates per serving.  The resulting number is the total amount of carbohydrates that you will be having. Learning standard serving sizes of other foods When you eat carbohydrate foods that are not packaged or do not include "Nutrition Facts" on the label, you need to measure the servings in order to count the amount of carbohydrates:  Measure the foods that you will eat with a food scale or measuring cup, if needed.   Decide how many standard-size servings you will eat.  Multiply the number of servings by 15. Most carbohydrate-rich foods have about 15 g of carbohydrates per serving. ? For example, if you eat 8 oz (170 g) of strawberries, you will have eaten 2 servings and 30 g of carbohydrates (2 servings x 15 g = 30 g).  For foods that have more than one food mixed, such as soups and casseroles, you must count the carbohydrates in each food that is included. The following list contains standard serving sizes of common carbohydrate-rich foods. Each of these servings has about 15 g of carbohydrates:   hamburger bun or  English muffin.   oz (15 mL) syrup.   oz (14 g) jelly.  1 slice of bread.  1 six-inch tortilla.  3 oz (85 g) cooked rice or pasta.  4 oz (113 g) cooked dried beans.  4 oz (113 g) starchy vegetable, such as peas, corn, or potatoes.  4 oz (113 g) hot cereal.  4 oz (113 g) mashed potatoes or  of a large baked potato.  4 oz (113 g) canned or frozen fruit.  4 oz (120 mL) fruit juice.  4-6 crackers.  6 chicken nuggets.  6 oz (170 g) unsweetened dry cereal.  6 oz (170 g) plain fat-free yogurt or yogurt sweetened with artificial sweeteners.  8 oz (240 mL) milk.  8 oz (170 g) fresh fruit or one small piece of fruit.  24 oz (680 g) popped popcorn. Example of carbohydrate counting Sample meal  3 oz (85 g) chicken breast.  6 oz (170 g)   brown rice.  4 oz (113 g) corn.  8 oz (240 mL) milk.  8 oz (170 g) strawberries with sugar-free whipped topping. Carbohydrate calculation 1. Identify the foods that contain carbohydrates: ? Rice. ? Corn. ? Milk. ? Strawberries. 2. Calculate how many servings you have of each food: ? 2 servings rice. ? 1 serving corn. ? 1 serving milk. ? 1 serving strawberries. 3. Multiply each number of servings by 15 g: ? 2 servings rice x 15 g = 30 g. ? 1 serving corn x 15 g = 15 g. ? 1 serving milk x 15 g = 15 g. ? 1 serving  strawberries x 15 g = 15 g. 4. Add together all of the amounts to find the total grams of carbohydrates eaten: ? 30 g + 15 g + 15 g + 15 g = 75 g of carbohydrates total. Summary  Carbohydrate counting is a method of keeping track of how many carbohydrates you eat.  Eating carbohydrates naturally increases the amount of sugar (glucose) in the blood.  Counting how many carbohydrates you eat helps keep your blood glucose within normal limits, which helps you manage your diabetes.  A diet and nutrition specialist (registered dietitian) can help you make a meal plan and calculate how many carbohydrates you should have at each meal and snack. This information is not intended to replace advice given to you by your health care provider. Make sure you discuss any questions you have with your health care provider. Document Released: 06/20/2005 Document Revised: 01/12/2017 Document Reviewed: 12/02/2015 Elsevier Patient Education  2020 [employer]  

## 2019-01-02 NOTE — Assessment & Plan Note (Signed)
Chronic, stable.  Continue current medication regimen.  Lipid panel today and CMP.

## 2019-01-02 NOTE — Assessment & Plan Note (Signed)
Chronic, stable.  A1C 5.8% today.  Continue current medication regimen and blood sugar checks at home.  Return in 6 months.

## 2019-01-03 LAB — CBC WITH DIFFERENTIAL/PLATELET
Basophils Absolute: 0.1 10*3/uL (ref 0.0–0.2)
Basos: 1 %
EOS (ABSOLUTE): 0.3 10*3/uL (ref 0.0–0.4)
Eos: 2 %
Hematocrit: 43.6 % (ref 34.0–46.6)
Hemoglobin: 14.7 g/dL (ref 11.1–15.9)
Immature Grans (Abs): 0 10*3/uL (ref 0.0–0.1)
Immature Granulocytes: 0 %
Lymphocytes Absolute: 2.6 10*3/uL (ref 0.7–3.1)
Lymphs: 25 %
MCH: 30.4 pg (ref 26.6–33.0)
MCHC: 33.7 g/dL (ref 31.5–35.7)
MCV: 90 fL (ref 79–97)
Monocytes Absolute: 1.2 10*3/uL — ABNORMAL HIGH (ref 0.1–0.9)
Monocytes: 11 %
Neutrophils Absolute: 6.4 10*3/uL (ref 1.4–7.0)
Neutrophils: 61 %
Platelets: 423 10*3/uL (ref 150–450)
RBC: 4.84 x10E6/uL (ref 3.77–5.28)
RDW: 13.1 % (ref 11.7–15.4)
WBC: 10.5 10*3/uL (ref 3.4–10.8)

## 2019-01-03 LAB — COMPREHENSIVE METABOLIC PANEL
ALT: 18 IU/L (ref 0–32)
AST: 20 IU/L (ref 0–40)
Albumin/Globulin Ratio: 2.4 — ABNORMAL HIGH (ref 1.2–2.2)
Albumin: 4.4 g/dL (ref 3.8–4.8)
Alkaline Phosphatase: 67 IU/L (ref 39–117)
BUN/Creatinine Ratio: 13 (ref 12–28)
BUN: 10 mg/dL (ref 8–27)
Bilirubin Total: 0.4 mg/dL (ref 0.0–1.2)
CO2: 23 mmol/L (ref 20–29)
Calcium: 9.5 mg/dL (ref 8.7–10.3)
Chloride: 103 mmol/L (ref 96–106)
Creatinine, Ser: 0.77 mg/dL (ref 0.57–1.00)
GFR calc Af Amer: 96 mL/min/{1.73_m2} (ref >59)
GFR calc non Af Amer: 84 mL/min/{1.73_m2} (ref >59)
Globulin, Total: 1.8 g/dL (ref 1.5–4.5)
Glucose: 78 mg/dL (ref 65–99)
Potassium: 4.9 mmol/L (ref 3.5–5.2)
Sodium: 141 mmol/L (ref 134–144)
Total Protein: 6.2 g/dL (ref 6.0–8.5)

## 2019-01-03 LAB — LIPID PANEL W/O CHOL/HDL RATIO
Cholesterol, Total: 109 mg/dL (ref 100–199)
HDL: 37 mg/dL — ABNORMAL LOW (ref >39)
LDL Calculated: 57 mg/dL (ref 0–99)
Triglycerides: 74 mg/dL (ref 0–149)
VLDL Cholesterol Cal: 15 mg/dL (ref 5–40)

## 2019-01-03 LAB — THYROID PANEL WITH TSH
Free Thyroxine Index: 3.6 (ref 1.2–4.9)
T3 Uptake Ratio: 31 % (ref 24–39)
T4, Total: 11.7 ug/dL (ref 4.5–12.0)
TSH: 0.813 u[IU]/mL (ref 0.450–4.500)

## 2019-01-03 LAB — MAGNESIUM: Magnesium: 2.1 mg/dL (ref 1.6–2.3)

## 2019-01-07 ENCOUNTER — Telehealth: Payer: Self-pay

## 2019-01-07 ENCOUNTER — Other Ambulatory Visit: Payer: Self-pay | Admitting: Nurse Practitioner

## 2019-01-07 MED ORDER — LISINOPRIL 10 MG PO TABS: 5.0000 mg | ORAL_TABLET | Freq: Every day | ORAL | 3 refills | Status: DC

## 2019-01-07 NOTE — Telephone Encounter (Signed)
Just got off the phone with [facility]. They do have the 10 mg in stock so could we possibly do 10 mg, take 1/2 tablet daily?

## 2019-01-07 NOTE — Progress Notes (Signed)
Lisinopril 5 MG on back order.  New script for Lisinopril 10 MG (take 1/2 tablet) sent.

## 2019-01-07 NOTE — Telephone Encounter (Signed)
Did it that way.  Thank you.  Sent in script.

## 2019-01-07 NOTE — Telephone Encounter (Signed)
Can we call pharmacy and find out what they recommend to change it too, what they have in stock.  Let me know.  Thanks.

## 2019-01-07 NOTE — Telephone Encounter (Signed)
Pharmacy sent a fax stating that the patient's lisinopril is currently on backorder. They want to know if we can change it to something else. Please advise and send new RX to pharmacy if appropriate.

## 2019-01-09 ENCOUNTER — Encounter: Payer: Managed Care, Other (non HMO) | Admitting: Family Medicine

## 2019-02-20 ENCOUNTER — Ambulatory Visit
Admission: RE | Admit: 2019-02-20 | Discharge: 2019-02-20 | Disposition: A | Discharge status: Home / Self Care | Payer: Managed Care, Other (non HMO) | Source: Ambulatory Visit | Attending: Family Medicine | Admitting: Family Medicine

## 2019-02-20 ENCOUNTER — Other Ambulatory Visit: Payer: Self-pay

## 2019-02-20 DIAGNOSIS — Z1231 Encounter for screening mammogram for malignant neoplasm of breast: Secondary | ICD-10-CM | POA: Diagnosis not present

## 2019-04-25 ENCOUNTER — Other Ambulatory Visit: Payer: Self-pay

## 2019-04-25 ENCOUNTER — Ambulatory Visit (INDEPENDENT_AMBULATORY_CARE_PROVIDER_SITE_OTHER): Payer: Managed Care, Other (non HMO)

## 2019-04-25 DIAGNOSIS — Z23 Encounter for immunization: Secondary | ICD-10-CM

## 2019-05-23 ENCOUNTER — Telehealth: Payer: Self-pay | Admitting: Family Medicine

## 2019-05-23 NOTE — Telephone Encounter (Signed)
Please fill out form and we will sign

## 2019-05-23 NOTE — Telephone Encounter (Signed)
Pt called back and states that she needs to speak with [facility] again about RX order they were just discussing.

## 2019-05-23 NOTE — Telephone Encounter (Signed)
Pt called and stated that the pharmacy will not longer cover one touch lancets and test strips. Pt states they will cover Contour, Next. Pt would like to know what she needs to do. She will need a new meter. Please advise

## 2019-05-23 NOTE — Telephone Encounter (Signed)
Order written and place in signature folder.

## 2019-05-23 NOTE — Telephone Encounter (Signed)
Prescription signed and will be faxed.

## 2019-07-07 ENCOUNTER — Encounter: Payer: Self-pay | Admitting: Nurse Practitioner

## 2019-07-08 ENCOUNTER — Other Ambulatory Visit: Payer: Self-pay

## 2019-07-08 ENCOUNTER — Ambulatory Visit (INDEPENDENT_AMBULATORY_CARE_PROVIDER_SITE_OTHER): Payer: Managed Care, Other (non HMO) | Admitting: Nurse Practitioner

## 2019-07-08 ENCOUNTER — Encounter: Payer: Self-pay | Admitting: Nurse Practitioner

## 2019-07-08 VITALS — BP 117/73 | HR 61 | Temp 98.0°F

## 2019-07-08 DIAGNOSIS — E1169 Type 2 diabetes mellitus with other specified complication: Secondary | ICD-10-CM

## 2019-07-08 DIAGNOSIS — E1159 Type 2 diabetes mellitus with other circulatory complications: Secondary | ICD-10-CM

## 2019-07-08 DIAGNOSIS — E119 Type 2 diabetes mellitus without complications: Secondary | ICD-10-CM

## 2019-07-08 DIAGNOSIS — E785 Hyperlipidemia, unspecified: Secondary | ICD-10-CM

## 2019-07-08 DIAGNOSIS — I10 Essential (primary) hypertension: Secondary | ICD-10-CM

## 2019-07-08 DIAGNOSIS — E039 Hypothyroidism, unspecified: Secondary | ICD-10-CM | POA: Diagnosis not present

## 2019-07-08 LAB — BAYER DCA HB A1C WAIVED: HB A1C (BAYER DCA - WAIVED): 6.1 % (ref <7.0)

## 2019-07-08 LAB — MICROALBUMIN, URINE WAIVED
Creatinine, Urine Waived: 10 mg/dL (ref 10–300)
Microalb, Ur Waived: 10 mg/L (ref 0–19)

## 2019-07-08 MED ORDER — METFORMIN HCL 500 MG PO TABS: 500.0000 mg | ORAL_TABLET | Freq: Two times a day (BID) | ORAL | 3 refills | Status: DC

## 2019-07-08 MED ORDER — LISINOPRIL 5 MG PO TABS: 5.0000 mg | ORAL_TABLET | Freq: Every day | ORAL | 3 refills | Status: DC

## 2019-07-08 MED ORDER — PRAVASTATIN SODIUM 40 MG PO TABS: 40.0000 mg | ORAL_TABLET | Freq: Every day | ORAL | 3 refills | Status: DC

## 2019-07-08 NOTE — Patient Instructions (Signed)
Carbohydrate Counting for Diabetes Mellitus, Adult  Carbohydrate counting is a method of keeping track of how many carbohydrates you eat. Eating carbohydrates naturally increases the amount of sugar (glucose) in the blood. Counting how many carbohydrates you eat helps keep your blood glucose within normal limits, which helps you manage your diabetes (diabetes mellitus). It is important to know how many carbohydrates you can safely have in each meal. This is different for every person. A diet and nutrition specialist (registered dietitian) can help you make a meal plan and calculate how many carbohydrates you should have at each meal and snack. Carbohydrates are found in the following foods:  Grains, such as breads and cereals.  Dried beans and soy products.  Starchy vegetables, such as potatoes, peas, and corn.  Fruit and fruit juices.  Milk and yogurt.  Sweets and snack foods, such as cake, cookies, candy, chips, and soft drinks. How do I count carbohydrates? There are two ways to count carbohydrates in food. You can use either of the methods or a combination of both. Reading "Nutrition Facts" on packaged food The "Nutrition Facts" list is included on the labels of almost all packaged foods and beverages in the [place] It includes:  The serving size.  Information about nutrients in each serving, including the grams (g) of carbohydrate per serving. To use the "Nutrition Facts":  Decide how many servings you will have.  Multiply the number of servings by the number of carbohydrates per serving.  The resulting number is the total amount of carbohydrates that you will be having. Learning standard serving sizes of other foods When you eat carbohydrate foods that are not packaged or do not include "Nutrition Facts" on the label, you need to measure the servings in order to count the amount of carbohydrates:  Measure the foods that you will eat with a food scale or measuring cup, if  needed.  Decide how many standard-size servings you will eat.  Multiply the number of servings by 15. Most carbohydrate-rich foods have about 15 g of carbohydrates per serving. ? For example, if you eat 8 oz (170 g) of strawberries, you will have eaten 2 servings and 30 g of carbohydrates (2 servings x 15 g = 30 g).  For foods that have more than one food mixed, such as soups and casseroles, you must count the carbohydrates in each food that is included. The following list contains standard serving sizes of common carbohydrate-rich foods. Each of these servings has about 15 g of carbohydrates:   hamburger bun or  English muffin.   oz (15 mL) syrup.   oz (14 g) jelly.  1 slice of bread.  1 six-inch tortilla.  3 oz (85 g) cooked rice or pasta.  4 oz (113 g) cooked dried beans.  4 oz (113 g) starchy vegetable, such as peas, corn, or potatoes.  4 oz (113 g) hot cereal.  4 oz (113 g) mashed potatoes or  of a large baked potato.  4 oz (113 g) canned or frozen fruit.  4 oz (120 mL) fruit juice.  4-6 crackers.  6 chicken nuggets.  6 oz (170 g) unsweetened dry cereal.  6 oz (170 g) plain fat-free yogurt or yogurt sweetened with artificial sweeteners.  8 oz (240 mL) milk.  8 oz (170 g) fresh fruit or one small piece of fruit.  24 oz (680 g) popped popcorn. Example of carbohydrate counting Sample meal  3 oz (85 g) chicken breast.  6 oz (170 g)   brown rice.  4 oz (113 g) corn.  8 oz (240 mL) milk.  8 oz (170 g) strawberries with sugar-free whipped topping. Carbohydrate calculation 1. Identify the foods that contain carbohydrates: ? Rice. ? Corn. ? Milk. ? Strawberries. 2. Calculate how many servings you have of each food: ? 2 servings rice. ? 1 serving corn. ? 1 serving milk. ? 1 serving strawberries. 3. Multiply each number of servings by 15 g: ? 2 servings rice x 15 g = 30 g. ? 1 serving corn x 15 g = 15 g. ? 1 serving milk x 15 g = 15 g. ? 1  serving strawberries x 15 g = 15 g. 4. Add together all of the amounts to find the total grams of carbohydrates eaten: ? 30 g + 15 g + 15 g + 15 g = 75 g of carbohydrates total. Summary  Carbohydrate counting is a method of keeping track of how many carbohydrates you eat.  Eating carbohydrates naturally increases the amount of sugar (glucose) in the blood.  Counting how many carbohydrates you eat helps keep your blood glucose within normal limits, which helps you manage your diabetes.  A diet and nutrition specialist (registered dietitian) can help you make a meal plan and calculate how many carbohydrates you should have at each meal and snack. This information is not intended to replace advice given to you by your health care provider. Make sure you discuss any questions you have with your health care provider. Document Revised: 01/12/2017 Document Reviewed: 12/02/2015 Elsevier Patient Education  2020 [employer]  

## 2019-07-08 NOTE — Progress Notes (Addendum)
BP 117/73 (BP Location: Left Arm, Patient Position: Sitting, Cuff Size: Normal)   Pulse 61   Temp 98 F (36.7 C) (Oral)   SpO2 98%    Subjective:    Patient ID: Susan Pineda, female    DOB: [birth year], 64 y.o.   MRN: [id number]  HPI: Susan Pineda is a 64 y.o. female  Chief Complaint  Patient presents with  . Follow-up    6 month   DIABETES Continues on Metformin 500 MG BID.  Last A1C July 5.8%. Hypoglycemic episodes:no Polydipsia/polyuria: no Visual disturbance: no Chest pain: no Paresthesias: no Glucose Monitoring: yes  Accucheck frequency: at times  Fasting glucose: 95 to 102  Post prandial:  Evening:  Before meals: Taking Insulin?: no  Long acting insulin:  Short acting insulin: Blood Pressure Monitoring: not checking Retinal Examination: Not up to Date Foot Exam: Up to Date Pneumovax: Not up to Date -- had one a few years ago Influenza: Up to Date Aspirin: yes   HYPERTENSION / HYPERLIPIDEMIA Continues on Lisinopril 5 MG daily and Pravastatin 40 MG. Satisfied with current treatment? yes Duration of hypertension: chronic BP monitoring frequency: not checking BP range:  BP medication side effects: no Duration of hyperlipidemia: chronic Cholesterol medication side effects: no Cholesterol supplements: none Medication compliance: good compliance Aspirin: yes Recent stressors: no Recurrent headaches: no Visual changes: no Palpitations: no Dyspnea: no Chest pain: no Lower extremity edema: no Dizzy/lightheaded: no   HYPOTHYROIDISM Followed by Dr. Gabriel Carina, continues on Levothyroxine 88 MCG.  Had thyroid removed in 2012. Thyroid control status:stable Satisfied with current treatment? yes Medication side effects: no Medication compliance: good compliance Etiology of hypothyroidism:  Recent dose adjustment:no Fatigue: no Cold intolerance: no Heat intolerance: no Weight gain: no Weight loss: no Constipation: no Diarrhea/loose stools: no Palpitations:  no Lower extremity edema: no Anxiety/depressed mood: no  Relevant past medical, surgical, family and social history reviewed and updated as indicated. Interim medical history since our last visit reviewed. Allergies and medications reviewed and updated.  Review of Systems  Constitutional: Negative for activity change, appetite change, diaphoresis, fatigue and fever.  Respiratory: Negative for cough, chest tightness and shortness of breath.   Cardiovascular: Negative for chest pain, palpitations and leg swelling.  Gastrointestinal: Negative.  Negative for vomiting.  Endocrine: Negative for cold intolerance, heat intolerance, polydipsia, polyphagia and polyuria.  Neurological: Negative.   Psychiatric/Behavioral: Negative.     Per HPI unless specifically indicated above     Objective:    BP 117/73 (BP Location: Left Arm, Patient Position: Sitting, Cuff Size: Normal)   Pulse 61   Temp 98 F (36.7 C) (Oral)   SpO2 98%   Wt Readings from Last 3 Encounters:  01/02/19 193 lb 9.6 oz (87.8 kg)  04/03/18 189 lb 6.4 oz (85.9 kg)  02/20/18 193 lb 1.6 oz (87.6 kg)    Physical Exam Vitals and nursing note reviewed.  Constitutional:      General: She is awake. She is not in acute distress.    Appearance: She is well-developed and overweight. She is not ill-appearing.  HENT:     Head: Normocephalic.     Right Ear: Hearing normal.     Left Ear: Hearing normal.  Eyes:     General: Lids are normal.        Right eye: No discharge.        Left eye: No discharge.     Conjunctiva/sclera: Conjunctivae normal.     Pupils: Pupils are equal, round,  and reactive to light.  Neck:     Vascular: No carotid bruit.  Cardiovascular:     Rate and Rhythm: Normal rate and regular rhythm.     Heart sounds: Normal heart sounds. No murmur. No gallop.   Pulmonary:     Effort: Pulmonary effort is normal. No accessory muscle usage or respiratory distress.     Breath sounds: Normal breath sounds.   Abdominal:     General: Bowel sounds are normal.     Palpations: Abdomen is soft.  Musculoskeletal:     Cervical back: Normal range of motion and neck supple.     Right lower leg: No edema.     Left lower leg: No edema.  Skin:    General: Skin is warm and dry.  Neurological:     Mental Status: She is alert and oriented to person, place, and time.  Psychiatric:        Attention and Perception: Attention normal.        Mood and Affect: Mood normal.        Behavior: Behavior normal. Behavior is cooperative.        Thought Content: Thought content normal.        Judgment: Judgment normal.    Diabetic Foot Exam - Simple   Simple Foot Form Visual Inspection No deformities, no ulcerations, no other skin breakdown bilaterally: Yes Sensation Testing Intact to touch and monofilament testing bilaterally: Yes Pulse Check Posterior Tibialis and Dorsalis pulse intact bilaterally: Yes Comments     Results for orders placed or performed in visit on 01/02/19  Microalbumin, Urine Waived  Result Value Ref Range   Microalb, Ur Waived 10 0 - 19 mg/L   Creatinine, Urine Waived 50 10 - 300 mg/dL   Microalb/Creat Ratio 30-300 (H) <30 mg/g      Assessment & Plan:   Problem List Items Addressed This Visit      Cardiovascular and Mediastinum   Hypertension associated with diabetes ([place])    Chronic, stable with BP at goal.  Continue current medication regimen, Lisinopril for kidney protection in T2DM.  BMP today.  Refills sent.      Relevant Medications   lisinopril (ZESTRIL) 5 MG tablet   pravastatin (PRAVACHOL) 40 MG tablet   metFORMIN (GLUCOPHAGE) 500 MG tablet   Other Relevant Orders   Bayer DCA Hb A1c Waived   Basic Metabolic Panel (BMET)     Endocrine   Diabetes mellitus without complication ([facility]CC) - Primary    Chronic, stable.  A1C 6.1% today.  Continue current medication regimen and blood sugar checks at home.  Return in 6 months.  Refills sent on Metformin.  Urine ALB  obtained.      Relevant Medications   lisinopril (ZESTRIL) 5 MG tablet   pravastatin (PRAVACHOL) 40 MG tablet   metFORMIN (GLUCOPHAGE) 500 MG tablet   Other Relevant Orders   Bayer DCA Hb A1c Waived   Microalbumin, Urine Waived here   Basic Metabolic Panel (BMET)   Hyperlipidemia associated with type 2 diabetes mellitus (HCC)    Chronic, stable.  Continue current medication regimen and adjust as needed.  Lipid panel today.  Refills sent.      Relevant Medications   lisinopril (ZESTRIL) 5 MG tablet   pravastatin (PRAVACHOL) 40 MG tablet   metFORMIN (GLUCOPHAGE) 500 MG tablet   Other Relevant Orders   Bayer DCA Hb A1c Waived   Lipid Panel w/o Chol/HDL Ratio out   Hypothyroidism    Continue current  medication regimen and collaboration with Dr. Gabriel Carina.            Follow up plan: Return in about 6 months (around 01/05/2020) for Annual physical.

## 2019-07-08 NOTE — Assessment & Plan Note (Signed)
Chronic, stable.  Continue current medication regimen and adjust as needed.  Lipid panel today.  Refills sent. 

## 2019-07-08 NOTE — Assessment & Plan Note (Addendum)
Chronic, stable.  A1C 6.1% today.  Continue current medication regimen and blood sugar checks at home.  Return in 6 months.  Refills sent on Metformin.  Urine ALB obtained.

## 2019-07-08 NOTE — Assessment & Plan Note (Signed)
Chronic, stable with BP at goal.  Continue current medication regimen, Lisinopril for kidney protection in T2DM.  BMP today.  Refills sent.

## 2019-07-08 NOTE — Assessment & Plan Note (Signed)
Continue current medication regimen and collaboration with Dr. Gabriel Carina.

## 2019-07-09 LAB — LIPID PANEL W/O CHOL/HDL RATIO
Cholesterol, Total: 150 mg/dL (ref 100–199)
HDL: 44 mg/dL (ref >39)
LDL Chol Calc (NIH): 87 mg/dL (ref 0–99)
Triglycerides: 100 mg/dL (ref 0–149)
VLDL Cholesterol Cal: 19 mg/dL (ref 5–40)

## 2019-07-09 LAB — BASIC METABOLIC PANEL
BUN/Creatinine Ratio: 18 (ref 12–28)
BUN: 13 mg/dL (ref 8–27)
CO2: 24 mmol/L (ref 20–29)
Calcium: 9.5 mg/dL (ref 8.7–10.3)
Chloride: 100 mmol/L (ref 96–106)
Creatinine, Ser: 0.73 mg/dL (ref 0.57–1.00)
GFR calc Af Amer: 102 mL/min/{1.73_m2} (ref >59)
GFR calc non Af Amer: 89 mL/min/{1.73_m2} (ref >59)
Glucose: 86 mg/dL (ref 65–99)
Potassium: 5.1 mmol/L (ref 3.5–5.2)
Sodium: 138 mmol/L (ref 134–144)

## 2020-01-07 ENCOUNTER — Ambulatory Visit (INDEPENDENT_AMBULATORY_CARE_PROVIDER_SITE_OTHER): Payer: Managed Care, Other (non HMO) | Admitting: Nurse Practitioner

## 2020-01-07 ENCOUNTER — Other Ambulatory Visit: Payer: Self-pay | Admitting: Nurse Practitioner

## 2020-01-07 ENCOUNTER — Encounter: Payer: Self-pay | Admitting: Nurse Practitioner

## 2020-01-07 ENCOUNTER — Other Ambulatory Visit: Payer: Self-pay

## 2020-01-07 VITALS — BP 109/67 | HR 60 | Temp 98.4°F | Ht 61.0 in | Wt 194.2 lb

## 2020-01-07 DIAGNOSIS — Z0000 Encounter for general adult medical examination without abnormal findings: Secondary | ICD-10-CM

## 2020-01-07 DIAGNOSIS — E1169 Type 2 diabetes mellitus with other specified complication: Secondary | ICD-10-CM | POA: Diagnosis not present

## 2020-01-07 DIAGNOSIS — E785 Hyperlipidemia, unspecified: Secondary | ICD-10-CM

## 2020-01-07 DIAGNOSIS — E1159 Type 2 diabetes mellitus with other circulatory complications: Secondary | ICD-10-CM | POA: Diagnosis not present

## 2020-01-07 DIAGNOSIS — Z1231 Encounter for screening mammogram for malignant neoplasm of breast: Secondary | ICD-10-CM | POA: Diagnosis not present

## 2020-01-07 DIAGNOSIS — E890 Postprocedural hypothyroidism: Secondary | ICD-10-CM | POA: Diagnosis not present

## 2020-01-07 DIAGNOSIS — I152 Hypertension secondary to endocrine disorders: Secondary | ICD-10-CM

## 2020-01-07 DIAGNOSIS — E119 Type 2 diabetes mellitus without complications: Secondary | ICD-10-CM

## 2020-01-07 DIAGNOSIS — I10 Essential (primary) hypertension: Secondary | ICD-10-CM

## 2020-01-07 DIAGNOSIS — E669 Obesity, unspecified: Secondary | ICD-10-CM | POA: Insufficient documentation

## 2020-01-07 LAB — BAYER DCA HB A1C WAIVED: HB A1C (BAYER DCA - WAIVED): 6.1 % (ref <7.0)

## 2020-01-07 MED ORDER — LISINOPRIL 5 MG PO TABS: 5.0000 mg | ORAL_TABLET | Freq: Every day | ORAL | 4 refills | Status: DC

## 2020-01-07 MED ORDER — METFORMIN HCL 500 MG PO TABS: 500.0000 mg | ORAL_TABLET | Freq: Two times a day (BID) | ORAL | 4 refills | Status: DC

## 2020-01-07 MED ORDER — CONTOUR NEXT TEST VI STRP: 1.0000 | ORAL_STRIP | Freq: Every day | 4 refills | Status: AC

## 2020-01-07 MED ORDER — MICROLET LANCETS MISC: 1.0000 | Freq: Every day | 3 refills | Status: DC | PRN

## 2020-01-07 MED ORDER — PRAVASTATIN SODIUM 40 MG PO TABS: 40.0000 mg | ORAL_TABLET | Freq: Every day | ORAL | 4 refills | Status: DC

## 2020-01-07 MED ORDER — CONTOUR NEXT TEST VI STRP: 1.0000 | ORAL_STRIP | Freq: Every day | 4 refills | Status: DC

## 2020-01-07 NOTE — Assessment & Plan Note (Signed)
BMI 36.69 with diabetes and HTN.  Recommended eating smaller high protein, low fat meals more frequently and exercising 30 mins a day 5 times a week with a goal of 10-15lb weight loss in the next 3 months. Patient voiced their understanding and motivation to adhere to these recommendations.

## 2020-01-07 NOTE — Progress Notes (Signed)
BP 109/67   Pulse 60   Temp 98.4 F (36.9 C) (Oral)   Ht 5\' 1"  (1.549 m)   Wt 194 lb 3.2 oz (88.1 kg)   SpO2 99%   BMI 36.69 kg/m    Subjective:    Patient ID: Susan Pineda, female    DOB: 05-30-1956, 64 y.o.   MRN: [id number]  HPI: Susan Pineda is a 64 y.o. female presenting on 01/07/2020 for comprehensive medical examination. Current medical complaints include:none  She currently lives with: self Menopausal Symptoms: no   DIABETES Continues on Metformin 500 MG BID.  Last A1C January was 6.1%. Hypoglycemic episodes:no Polydipsia/polyuria: no Visual disturbance: no Chest pain: no Paresthesias: no Glucose Monitoring: yes             Accucheck frequency: not checking             Fasting glucose:              Post prandial:             Evening:             Before meals: Taking Insulin?: no             Long acting insulin:             Short acting insulin: Blood Pressure Monitoring: not checking Retinal Examination: Up To Date -- had done 2-3 weeks ago [place] Exam: Up to Date Pneumovax: Had PPSV23 in 2011 Influenza: Up to Date Aspirin: yes   HYPERTENSION / HYPERLIPIDEMIA Continues on Lisinopril 5 MG daily and Pravastatin 40 MG. Satisfied with current treatment? yes Duration of hypertension: chronic BP monitoring frequency: not checking BP range:  BP medication side effects: no Duration of hyperlipidemia: chronic Cholesterol medication side effects: no Cholesterol supplements: none Medication compliance: good compliance Aspirin: yes Recent stressors: no Recurrent headaches: no Visual changes: no Palpitations: no Dyspnea: no Chest pain: no Lower extremity edema: no Dizzy/lightheaded: no   HYPOTHYROIDISM Followed by Dr. Gabriel Carina, continues on Levothyroxine 88 MCG.  Had thyroid removed in 2012. Last saw in January Dr. Gabriel Carina and needs to return annually. Thyroid control status:stable Satisfied with current treatment? yes Medication side effects:  no Medication compliance: good compliance Etiology of hypothyroidism:  Recent dose adjustment:no Fatigue: no Cold intolerance: no Heat intolerance: no Weight gain: no Weight loss: no Constipation: no Diarrhea/loose stools: no Palpitations: no Lower extremity edema: no Anxiety/depressed mood: no  Depression Screen done today and results listed below:  Depression screen [facility] 2/9 01/07/2020 01/02/2019 09/19/2017 03/27/2017 09/21/2016  Decreased Interest 0 0 0 0 0  Down, Depressed, Hopeless 0 0 0 0 0  PHQ - 2 Score 0 0 0 0 0  Altered sleeping - 0 0 - -  Tired, decreased energy - 0 0 - -  Change in appetite - 0 0 - -  Feeling bad or failure about yourself  - 0 0 - -  Trouble concentrating - 0 0 - -  Moving slowly or fidgety/restless - 0 0 - -  Suicidal thoughts - 0 0 - -  PHQ-9 Score - 0 0 - -  Difficult doing work/chores - Not difficult at all - - -    The patient does not have a history of falls. I did not complete a risk assessment for falls. A plan of care for falls was not documented.   Past Medical History:  Past Medical History:  Diagnosis Date  . Anemia    h/o  .  Cancer ([place]) 2012   THYROID CA  . Chronic kidney disease    KIDNEY STONES  . Diabetes mellitus without complication ([place])   . Diverticulitis   . GERD (gastroesophageal reflux disease)   . Hyperlipidemia   . Hypertension   . Hypothyroidism     Surgical History:  Past Surgical History:  Procedure Laterality Date  . ABDOMINAL HYSTERECTOMY  1999  . HERNIA REPAIR    . HERNIA REPAIR  2012, 2013   Dr Rochel Brome  . LITHOTRIPSY     X2  . PARATHYROIDECTOMY Right   . 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 SUBMANDIBULAR GLAND EXCISION Right 12/18/2014   Procedure: Right submandibular gland and subligual gland excision;  Surgeon: Carloyn Manner, MD;  Location: [facility] ORS;  Service: ENT;  Laterality: Right;  . THYROIDECTOMY    . VENTRAL HERNIA REPAIR N/A 06/02/2015   Procedure: HERNIA  REPAIR VENTRAL ADULT, laparoscopic;  Surgeon: Robert Bellow, MD;  Location: [facility] ORS;  Service: General;  Laterality: N/A;    Medications:  Current Outpatient Medications on File Prior to Visit  Medication Sig  . aspirin EC 81 MG tablet Take 81 mg by mouth daily.  . Cinnamon 500 MG TABS Take 1 tablet by mouth daily.  . Cranberry 1000 MG CAPS Take 1 capsule by mouth daily.  . Famotidine-Ca Carb-Mag Hydrox (PEPCID COMPLETE PO) Take 1 tablet by mouth as needed.  Marland Kitchen levothyroxine (SYNTHROID, LEVOTHROID) 88 MCG tablet Take 88 mcg by mouth daily.   No current facility-administered medications on file prior to visit.    Allergies:  No Known Allergies  Social History:  Social History   Socioeconomic History  . Marital status: Divorced    Spouse name: Not on file  . Number of children: Not on file  . Years of education: Not on file  . Highest education level: Not on file  Occupational History  . Not on file  Tobacco Use  . Smoking status: Former Smoker    Packs/day: 0.25    Years: 42.00    Pack years: 10.50    Types: Cigarettes    Quit date: 08/05/2015    Years since quitting: 4.4  . Smokeless tobacco: Never Used  Vaping Use  . Vaping Use: Never used  Substance and Sexual Activity  . Alcohol use: No    Alcohol/week: 0.0 standard drinks  . Drug use: No  . Sexual activity: Not Currently  Other Topics Concern  . Not on file  Social History Narrative  . Not on file   Social Determinants of Health   Financial Resource Strain:   . Difficulty of Paying Living Expenses:   Food Insecurity:   . Worried About Charity fundraiser in the Last Year:   . Arboriculturist in the Last Year:   Transportation Needs:   . Film/video editor (Medical):   Marland Kitchen Lack of Transportation (Non-Medical):   Physical Activity:   . Days of Exercise per Week:   . Minutes of Exercise per Session:   Stress:   . Feeling of Stress :   Social Connections:   . Frequency of Communication with  Friends and Family:   . Frequency of Social Gatherings with Friends and Family:   . Attends Religious Services:   . Active Member of Clubs or Organizations:   . Attends Archivist Meetings:   Marland Kitchen Marital Status:   Intimate Partner Violence:   . Fear of Current or Ex-Partner:   .  Emotionally Abused:   Marland Kitchen Physically Abused:   . Sexually Abused:    Social History   Tobacco Use  Smoking Status Former Smoker  . Packs/day: 0.25  . Years: 42.00  . Pack years: 10.50  . Types: Cigarettes  . Quit date: 08/05/2015  . Years since quitting: 4.4  Smokeless Tobacco Never Used   Social History   Substance and Sexual Activity  Alcohol Use No  . Alcohol/week: 0.0 standard drinks    Family History:  Family History  Problem Relation Age of Onset  . Cancer Mother   . Cancer Father   . Breast cancer Neg Hx     Past medical history, surgical history, medications, allergies, family history and social history reviewed with patient today and changes made to appropriate areas of the chart.   Review of Systems - negative All other ROS negative except what is listed above and in the HPI.      Objective:    BP 109/67   Pulse 60   Temp 98.4 F (36.9 C) (Oral)   Ht 5\' 1"  (1.549 m)   Wt 194 lb 3.2 oz (88.1 kg)   SpO2 99%   BMI 36.69 kg/m   Wt Readings from Last 3 Encounters:  01/07/20 194 lb 3.2 oz (88.1 kg)  01/02/19 193 lb 9.6 oz (87.8 kg)  04/03/18 189 lb 6.4 oz (85.9 kg)    Physical Exam Constitutional:      General: She is awake. She is not in acute distress.    Appearance: She is well-developed. She is not ill-appearing.  HENT:     Head: Normocephalic and atraumatic.     Right Ear: Hearing, tympanic membrane, ear canal and external ear normal. No drainage.     Left Ear: Hearing, tympanic membrane, ear canal and external ear normal. No drainage.     Nose: Nose normal.     Right Sinus: No maxillary sinus tenderness or frontal sinus tenderness.     Left Sinus: No  maxillary sinus tenderness or frontal sinus tenderness.     Mouth/Throat:     Mouth: Mucous membranes are moist.     Pharynx: Oropharynx is clear. Uvula midline. No pharyngeal swelling, oropharyngeal exudate or posterior oropharyngeal erythema.  Eyes:     General: Lids are normal.        Right eye: No discharge.        Left eye: No discharge.     Extraocular Movements: Extraocular movements intact.     Conjunctiva/sclera: Conjunctivae normal.     Pupils: Pupils are equal, round, and reactive to light.     Visual Fields: Right eye visual fields normal and left eye visual fields normal.  Neck:     Thyroid: No thyromegaly.     Vascular: No carotid bruit.     Trachea: Trachea normal.  Cardiovascular:     Rate and Rhythm: Normal rate and regular rhythm.     Heart sounds: Normal heart sounds. No murmur heard.  No gallop.   Pulmonary:     Effort: Pulmonary effort is normal. No accessory muscle usage or respiratory distress.     Breath sounds: Normal breath sounds.  Chest:     Breasts:        Right: Normal.        Left: Normal.  Abdominal:     General: Bowel sounds are normal.     Palpations: Abdomen is soft. There is no hepatomegaly or splenomegaly.     Tenderness: There is no  abdominal tenderness.  Musculoskeletal:        General: Normal range of motion.     Cervical back: Normal range of motion and neck supple.     Right lower leg: No edema.     Left lower leg: No edema.  Lymphadenopathy:     Head:     Right side of head: No submental, submandibular, tonsillar, preauricular or posterior auricular adenopathy.     Left side of head: No submental, submandibular, tonsillar, preauricular or posterior auricular adenopathy.     Cervical: No cervical adenopathy.     Upper Body:     Right upper body: No supraclavicular, axillary or pectoral adenopathy.     Left upper body: No supraclavicular, axillary or pectoral adenopathy.  Skin:    General: Skin is warm and dry.     Capillary  Refill: Capillary refill takes less than 2 seconds.     Findings: No rash.  Neurological:     Mental Status: She is alert and oriented to person, place, and time.     Cranial Nerves: Cranial nerves are intact.     Gait: Gait is intact.     Deep Tendon Reflexes: Reflexes are normal and symmetric.     Reflex Scores:      Brachioradialis reflexes are 2+ on the right side and 2+ on the left side.      Patellar reflexes are 2+ on the right side and 2+ on the left side. Psychiatric:        Attention and Perception: Attention normal.        Mood and Affect: Mood normal.        Speech: Speech normal.        Behavior: Behavior normal. Behavior is cooperative.        Thought Content: Thought content normal.        Judgment: Judgment normal.     Results for orders placed or performed in visit on 07/08/19  Bayer DCA Hb A1c Waived  Result Value Ref Range   HB A1C (BAYER DCA - WAIVED) 6.1 <7.0 %  Microalbumin, Urine Waived here  Result Value Ref Range   Microalb, Ur Waived 10 0 - 19 mg/L   Creatinine, Urine Waived 10 10 - 300 mg/dL   Microalb/Creat Ratio 30-300 (H) <30 mg/g  Basic Metabolic Panel (BMET)  Result Value Ref Range   Glucose 86 65 - 99 mg/dL   BUN 13 8 - 27 mg/dL   Creatinine, Ser 0.73 0.57 - 1.00 mg/dL   GFR calc non Af Amer 89 >59 mL/min/1.73   GFR calc Af Amer 102 >59 mL/min/1.73   BUN/Creatinine Ratio 18 12 - 28   Sodium 138 134 - 144 mmol/L   Potassium 5.1 3.5 - 5.2 mmol/L   Chloride 100 96 - 106 mmol/L   CO2 24 20 - 29 mmol/L   Calcium 9.5 8.7 - 10.3 mg/dL  Lipid Panel w/o Chol/HDL Ratio out  Result Value Ref Range   Cholesterol, Total 150 100 - 199 mg/dL   Triglycerides 100 0 - 149 mg/dL   HDL 44 >39 mg/dL   VLDL Cholesterol Cal 19 5 - 40 mg/dL   LDL Chol Calc (NIH) 87 0 - 99 mg/dL      Assessment & Plan:   Problem List Items Addressed This Visit      Cardiovascular and Mediastinum   Hypertension associated with diabetes ([place])    Chronic, stable with BP  at goal.  Continue current medication regimen,  Lisinopril for kidney protection in T2DM.  CMP today.  Refills sent.      Relevant Medications   metFORMIN (GLUCOPHAGE) 500 MG tablet   lisinopril (ZESTRIL) 5 MG tablet   pravastatin (PRAVACHOL) 40 MG tablet   Other Relevant Orders   Bayer DCA Hb A1c Waived   CBC with Differential/Platelet     Endocrine   Type 2 diabetes mellitus with obesity ([facility])    Chronic, stable.  A1C 6.1% today.  Continue current medication regimen and blood sugar checks at home.  Return in 6 months.  Refills sent on Metformin.  Urine ALB obtained last visit.      Relevant Medications   metFORMIN (GLUCOPHAGE) 500 MG tablet   lisinopril (ZESTRIL) 5 MG tablet   pravastatin (PRAVACHOL) 40 MG tablet   Other Relevant Orders   Bayer DCA Hb A1c Waived   Hyperlipidemia associated with type 2 diabetes mellitus ([facility])    Chronic, stable.  Continue current medication regimen and adjust as needed.  Lipid panel today.  Refills sent.      Relevant Medications   metFORMIN (GLUCOPHAGE) 500 MG tablet   lisinopril (ZESTRIL) 5 MG tablet   pravastatin (PRAVACHOL) 40 MG tablet   Other Relevant Orders   Bayer DCA Hb A1c Waived   Comprehensive metabolic panel   Lipid Panel w/o Chol/HDL Ratio   Postprocedural hypothyroidism    History of thyroid cancer with postprocedural hypothyroidism.  Continue current medication regimen and collaboration with Dr. Gabriel Carina.  TSH today.      Relevant Orders   TSH     Other   Morbid obesity ([place])    BMI 36.69 with diabetes and HTN.  Recommended eating smaller high protein, low fat meals more frequently and exercising 30 mins a day 5 times a week with a goal of 10-15lb weight loss in the next 3 months. Patient voiced their understanding and motivation to adhere to these recommendations.       Relevant Medications   metFORMIN (GLUCOPHAGE) 500 MG tablet    Other Visit Diagnoses    PE (physical exam), annual    -  Primary   Annual labs today  to include CBC, CMP, lipid, TSH   Encounter for screening mammogram for malignant neoplasm of breast       Relevant Orders   MM DIGITAL SCREENING BILATERAL       Follow up plan: Return in about 6 months (around 07/09/2020) for T2DM, HTN/HLD.   LABORATORY TESTING:  - Pap smear: not applicable  IMMUNIZATIONS:   - Tdap: Tetanus vaccination status reviewed: last tetanus booster within 10 years. - Influenza: Up to date - Pneumovax: Up to date - Prevnar: Not applicable - HPV: Not applicable - Zostavax vaccine: Refused  SCREENING: -Mammogram: Up to date  - Colonoscopy: Refused  - Bone Density: Not applicable  -Hearing Test: Not applicable  -Spirometry: Not applicable   PATIENT COUNSELING:   Advised to take 1 mg of folate supplement per day if capable of pregnancy.   Sexuality: Discussed sexually transmitted diseases, partner selection, use of condoms, avoidance of unintended pregnancy  and contraceptive alternatives.   Advised to avoid cigarette smoking.  I discussed with the patient that most people either abstain from alcohol or drink within safe limits (<=14/week and <=4 drinks/occasion for males, <=7/weeks and <= 3 drinks/occasion for females) and that the risk for alcohol disorders and other health effects rises proportionally with the number of drinks per week and how often a drinker exceeds daily limits.  Discussed cessation/primary prevention of drug use and availability of treatment for abuse.   Diet: Encouraged to adjust caloric intake to maintain  or achieve ideal body weight, to reduce intake of dietary saturated fat and total fat, to limit sodium intake by avoiding high sodium foods and not adding table salt, and to maintain adequate dietary potassium and calcium preferably from fresh fruits, vegetables, and low-fat dairy products.    stressed the importance of regular exercise  Injury prevention: Discussed safety belts, safety helmets, smoke detector, smoking near  bedding or upholstery.   Dental health: Discussed importance of regular tooth brushing, flossing, and dental visits.    NEXT PREVENTATIVE PHYSICAL DUE IN 1 YEAR. Return in about 6 months (around 07/09/2020) for T2DM, HTN/HLD.

## 2020-01-07 NOTE — Patient Instructions (Signed)

## 2020-01-07 NOTE — Assessment & Plan Note (Signed)
History of thyroid cancer with postprocedural hypothyroidism.  Continue current medication regimen and collaboration with Dr. Gabriel Carina.  TSH today.

## 2020-01-07 NOTE — Assessment & Plan Note (Signed)
Chronic, stable.  A1C 6.1% today.  Continue current medication regimen and blood sugar checks at home.  Return in 6 months.  Refills sent on Metformin.  Urine ALB obtained last visit.

## 2020-01-07 NOTE — Telephone Encounter (Signed)
Pt was to call back and let [place] know which strips/lancets she needs. (cause pt forgot)  Contour next test strips microlet colored lancets  [facility] [place], [place] AT [facility] OF [address] Phone:  [phone]  Fax:  [phone]

## 2020-01-07 NOTE — Assessment & Plan Note (Signed)
Chronic, stable with BP at goal.  Continue current medication regimen, Lisinopril for kidney protection in T2DM.  CMP today.  Refills sent.

## 2020-01-07 NOTE — Assessment & Plan Note (Signed)
Chronic, stable.  Continue current medication regimen and adjust as needed.  Lipid panel today.  Refills sent.

## 2020-01-08 ENCOUNTER — Other Ambulatory Visit: Payer: Self-pay | Admitting: Nurse Practitioner

## 2020-01-08 DIAGNOSIS — D72821 Monocytosis (symptomatic): Secondary | ICD-10-CM

## 2020-01-08 LAB — CBC WITH DIFFERENTIAL/PLATELET
Basophils Absolute: 0.1 10*3/uL (ref 0.0–0.2)
Basos: 1 %
EOS (ABSOLUTE): 0.2 10*3/uL (ref 0.0–0.4)
Eos: 2 %
Hematocrit: 46.2 % (ref 34.0–46.6)
Hemoglobin: 15.5 g/dL (ref 11.1–15.9)
Immature Grans (Abs): 0.1 10*3/uL (ref 0.0–0.1)
Immature Granulocytes: 1 %
Lymphocytes Absolute: 2.4 10*3/uL (ref 0.7–3.1)
Lymphs: 22 %
MCH: 30.3 pg (ref 26.6–33.0)
MCHC: 33.5 g/dL (ref 31.5–35.7)
MCV: 90 fL (ref 79–97)
Monocytes Absolute: 1.3 10*3/uL — ABNORMAL HIGH (ref 0.1–0.9)
Monocytes: 12 %
Neutrophils Absolute: 6.8 10*3/uL (ref 1.4–7.0)
Neutrophils: 62 %
Platelets: 378 10*3/uL (ref 150–450)
RBC: 5.12 x10E6/uL (ref 3.77–5.28)
RDW: 13.2 % (ref 11.7–15.4)
WBC: 10.9 10*3/uL — ABNORMAL HIGH (ref 3.4–10.8)

## 2020-01-08 LAB — COMPREHENSIVE METABOLIC PANEL
ALT: 13 IU/L (ref 0–32)
AST: 15 IU/L (ref 0–40)
Albumin/Globulin Ratio: 2 (ref 1.2–2.2)
Albumin: 4.4 g/dL (ref 3.8–4.8)
Alkaline Phosphatase: 78 IU/L (ref 48–121)
BUN/Creatinine Ratio: 16 (ref 12–28)
BUN: 12 mg/dL (ref 8–27)
Bilirubin Total: 0.4 mg/dL (ref 0.0–1.2)
CO2: 23 mmol/L (ref 20–29)
Calcium: 9.6 mg/dL (ref 8.7–10.3)
Chloride: 99 mmol/L (ref 96–106)
Creatinine, Ser: 0.73 mg/dL (ref 0.57–1.00)
GFR calc Af Amer: 102 mL/min/{1.73_m2} (ref >59)
GFR calc non Af Amer: 89 mL/min/{1.73_m2} (ref >59)
Globulin, Total: 2.2 g/dL (ref 1.5–4.5)
Glucose: 91 mg/dL (ref 65–99)
Potassium: 5 mmol/L (ref 3.5–5.2)
Sodium: 138 mmol/L (ref 134–144)
Total Protein: 6.6 g/dL (ref 6.0–8.5)

## 2020-01-08 LAB — LIPID PANEL W/O CHOL/HDL RATIO
Cholesterol, Total: 153 mg/dL (ref 100–199)
HDL: 39 mg/dL — ABNORMAL LOW (ref >39)
LDL Chol Calc (NIH): 95 mg/dL (ref 0–99)
Triglycerides: 102 mg/dL (ref 0–149)
VLDL Cholesterol Cal: 19 mg/dL (ref 5–40)

## 2020-01-08 LAB — TSH: TSH: 1.08 u[IU]/mL (ref 0.450–4.500)

## 2020-01-08 NOTE — Progress Notes (Signed)
Contacted via [place] afternoon Ms. Susan Pineda, your labs have returned: - White blood cell count is mildly elevated and monocytes remain mildly elevated on absolute count.  I would like to recheck this on outpatient lab only visit in 6 weeks, only lab visit.  Can you schedule this please?  Your monocytes have been mildly elevated for awhile and white blood cells fluctuate. - Kidney, liver, and electrolytes look good.   - thyroid looks good -- continue current Levothyroxine dose. - Cholesterol levels could use a little tighter control, you may benefit from increase in Pravastatin at next visit to 80 MG to get LDL <70.   Any questions? Keep being awesome!! Kindest regards, Beni Turrell

## 2020-01-21 ENCOUNTER — Telehealth: Payer: Self-pay | Admitting: Nurse Practitioner

## 2020-01-21 NOTE — Telephone Encounter (Signed)
Pt is here and brought her form for  her physical to be signed ,  the patient also stated that she called her insurance about the color guard and it is covered by her insurance. Form palaced in providers folder.

## 2020-01-22 ENCOUNTER — Other Ambulatory Visit: Payer: Self-pay | Admitting: Nurse Practitioner

## 2020-01-22 DIAGNOSIS — Z1211 Encounter for screening for malignant neoplasm of colon: Secondary | ICD-10-CM

## 2020-01-22 NOTE — Telephone Encounter (Signed)
Form filled out and placed in folder for signature.   Routing to provider for Cologuard order.

## 2020-01-22 NOTE — Telephone Encounter (Signed)
Cologuard order in

## 2020-01-22 NOTE — Telephone Encounter (Signed)
Cologuard form filled out and placed in folder for signature.

## 2020-01-22 NOTE — Telephone Encounter (Signed)
Cologuard order signed and faxed. Paperwork completed and ready for pick up. Called and LVM letting patient know that these were done for her.

## 2020-01-22 NOTE — Telephone Encounter (Signed)
Patient would like to know if she is supposed to come into office to pick up cologuard.

## 2020-01-22 NOTE — Telephone Encounter (Signed)
Called and let patient know that the Cologuard would be mailed to her from [employer].   Also let patient know that I would contact her when her paperwork was ready to be picked up.

## 2020-01-22 NOTE — Progress Notes (Signed)
cologuard order

## 2020-02-06 LAB — COLOGUARD: Cologuard: NEGATIVE

## 2020-02-19 ENCOUNTER — Other Ambulatory Visit: Payer: Self-pay

## 2020-02-19 ENCOUNTER — Other Ambulatory Visit: Payer: Managed Care, Other (non HMO)

## 2020-02-19 DIAGNOSIS — D72821 Monocytosis (symptomatic): Secondary | ICD-10-CM

## 2020-02-20 LAB — CBC WITH DIFFERENTIAL/PLATELET
Basophils Absolute: 0.1 10*3/uL (ref 0.0–0.2)
Basos: 1 %
EOS (ABSOLUTE): 0.3 10*3/uL (ref 0.0–0.4)
Eos: 2 %
Hematocrit: 45.7 % (ref 34.0–46.6)
Hemoglobin: 15.2 g/dL (ref 11.1–15.9)
Immature Grans (Abs): 0.1 10*3/uL (ref 0.0–0.1)
Immature Granulocytes: 1 %
Lymphocytes Absolute: 2.2 10*3/uL (ref 0.7–3.1)
Lymphs: 21 %
MCH: 30.2 pg (ref 26.6–33.0)
MCHC: 33.3 g/dL (ref 31.5–35.7)
MCV: 91 fL (ref 79–97)
Monocytes Absolute: 1.3 10*3/uL — ABNORMAL HIGH (ref 0.1–0.9)
Monocytes: 12 %
Neutrophils Absolute: 6.8 10*3/uL (ref 1.4–7.0)
Neutrophils: 63 %
Platelets: 372 10*3/uL (ref 150–450)
RBC: 5.03 x10E6/uL (ref 3.77–5.28)
RDW: 13 % (ref 11.7–15.4)
WBC: 10.6 10*3/uL (ref 3.4–10.8)

## 2020-02-20 NOTE — Progress Notes (Signed)
Contacted via [place] morning Renelda, your labs have returned.  Overall white blood cell count has returned to normal, but monocytes remain slightly elevated however have remained around same level over past 4 years.  We will recheck next visit and could consider referral to hematology for further assessment if ongoing mild elevation.  Any questions? Keep being awesome!!  Thank you for allowing me to participate in your care. Kindest regards, Karolyne Timmons

## 2020-04-05 ENCOUNTER — Ambulatory Visit
Admission: EM | Admit: 2020-04-05 | Discharge: 2020-04-05 | Disposition: A | Discharge status: Home / Self Care | Payer: Managed Care, Other (non HMO) | Attending: Internal Medicine | Admitting: Internal Medicine

## 2020-04-05 ENCOUNTER — Other Ambulatory Visit: Payer: Self-pay

## 2020-04-05 ENCOUNTER — Encounter: Payer: Self-pay | Admitting: Gynecology

## 2020-04-05 DIAGNOSIS — L259 Unspecified contact dermatitis, unspecified cause: Secondary | ICD-10-CM

## 2020-04-05 MED ORDER — METHYLPREDNISOLONE 4 MG PO TBPK
ORAL_TABLET | ORAL | 0 refills | Status: DC
Start: 2020-04-05 — End: 2020-07-09

## 2020-04-05 MED ORDER — HYDROCORTISONE 2.5 % EX LOTN
TOPICAL_LOTION | Freq: Two times a day (BID) | CUTANEOUS | 0 refills | Status: DC
Start: 2020-04-05 — End: 2021-01-12

## 2020-04-05 NOTE — ED Provider Notes (Signed)
[facility]    CSN: [id number] Arrival date & time: 04/05/20  1350      History   Chief Complaint Chief Complaint  Patient presents with  . Pruritis  . Rash    HPI Susan Pineda is a 64 y.o. female who presents today for evaluation of rash.  The patient noticed the rash last night.  The patient got up to use the bathroom when she noticed that she had a rash on several areas of her body which was extremely itchy.  She denies any new exposures.  No new deodorant or detergent.  She denies any new exposures such as poison ivy or poison oak.  She denies any drainage from the lesions.  She denies any new foods.  She does report some lip swelling but denies any shortness of breath or feelings that she cannot take a deep breath.  Presents today for further evaluation.  No chest pain, no vision changes.  No numbness or tingling.  HPI  Past Medical History:  Diagnosis Date  . Anemia    h/o  . Cancer ([place]) 2012   THYROID CA  . Chronic kidney disease    KIDNEY STONES  . Diabetes mellitus without complication ([place])   . Diverticulitis   . GERD (gastroesophageal reflux disease)   . Hyperlipidemia   . Hypertension   . Hypothyroidism     Patient Active Problem List   Diagnosis Date Noted  . Morbid obesity ([place]) 01/07/2020  . S/P hysterectomy 09/21/2016  . 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 Recurrent ventral hernia 03/11/2015  . Hypertension associated with diabetes ([place]) 03/04/2015  . Type 2 diabetes mellitus with obesity ([place]) 03/04/2015  . Hyperlipidemia associated with type 2 diabetes mellitus ([place]) 03/04/2015  . Incisional hernia 03/04/2015  . Hx of submandibular gland removal 12/18/2014    Past Surgical History:  Procedure Laterality Date  . ABDOMINAL HYSTERECTOMY  1999  . HERNIA REPAIR    . HERNIA REPAIR  2012, 2013   Dr Rochel Brome  . LITHOTRIPSY     X2  . PARATHYROIDECTOMY Right   . 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  SUBMANDIBULAR GLAND EXCISION Right 12/18/2014   Procedure: Right submandibular gland and subligual gland excision;  Surgeon: Carloyn Manner, MD;  Location: [facility] ORS;  Service: ENT;  Laterality: Right;  . THYROIDECTOMY    . VENTRAL HERNIA REPAIR N/A 06/02/2015   Procedure: HERNIA REPAIR VENTRAL ADULT, laparoscopic;  Surgeon: Robert Bellow, MD;  Location: [facility] OR[facility];  Service: General;  Laterality: N/A;    OB History    Gravida  3   Para  3   Term      Preterm      AB      Living        SAB      TAB      Ectopic      Multiple      Live Births           Obstetric Comments  1st Menstrual Cycle:  13  1st Pregnancy:  18         Home Medications    Prior to Admission medications   Medication Sig Start Date End Date Taking? Authorizing Provider  aspirin EC 81 MG tablet Take 81 mg by mouth daily.   Yes [provider]  Cinnamon 500 MG TABS Take 1 tablet by mouth daily.   Yes [provider]  Cranberry 1000 MG CAPS Take 1  capsule by mouth daily.   Yes [provider]  Famotidine-Ca Carb-Mag Hydrox (PEPCID COMPLETE PO) Take 1 tablet by mouth as needed.   Yes [provider]  glucose blood (CONTOUR NEXT TEST) test strip 1 each by Other route daily. Use as instructed 01/07/20 04/06/20 Yes Cannady, Jolene T, NP  levothyroxine (SYNTHROID, LEVOTHROID) 88 MCG tablet Take 88 mcg by mouth daily. 01/10/17  Yes [provider]  lisinopril (ZESTRIL) 5 MG tablet Take 1 tablet (5 mg total) by mouth daily. 01/07/20  Yes Cannady, Jolene T, NP  metFORMIN (GLUCOPHAGE) 500 MG tablet Take 1 tablet (500 mg total) by mouth 2 (two) times daily. 01/07/20  Yes Cannady, Henrine Screws T, NP  Microlet Lancets MISC 1 each by Does not apply route daily as needed. 01/07/20  Yes Cannady, Jolene T, NP  pravastatin (PRAVACHOL) 40 MG tablet Take 1 tablet (40 mg total) by mouth at bedtime. 01/07/20  Yes Cannady, Jolene T, NP  hydrocortisone 2.5 % lotion Apply topically 2 (two)  times daily. 04/05/20   Lattie Corns, PA-C  methylPREDNISolone (MEDROL DOSEPAK) 4 MG TBPK tablet Take per package instructions 04/05/20   Lattie Corns, PA-C    Family History Family History  Problem Relation Age of Onset  . Cancer Mother   . Cancer Father   . Breast cancer Neg Hx     Social History Social History   Tobacco Use  . Smoking status: Former Smoker    Packs/day: 0.25    Years: 42.00    Pack years: 10.50    Types: Cigarettes    Quit date: 08/05/2015    Years since quitting: 4.6  . Smokeless tobacco: Never Used  Vaping Use  . Vaping Use: Never used  Substance Use Topics  . Alcohol use: No    Alcohol/week: 0.0 standard drinks  . Drug use: No     Allergies   Patient has no known allergies.   Review of Systems Review of Systems  Skin: Positive for rash.  All other systems reviewed and are negative.  Physical Exam Triage Vital Signs ED Triage Vitals  Enc Vitals Group     BP 04/05/20 1439 110/68     Pulse Rate 04/05/20 1439 72     Resp 04/05/20 1439 16     Temp 04/05/20 1439 98.3 F (36.8 C)     Temp Source 04/05/20 1439 Oral     SpO2 04/05/20 1439 99 %     Weight 04/05/20 1436 195 lb (88.5 kg)     Height 04/05/20 1436 5\' 1"  (1.549 m)     Head Circumference --      Peak Flow --      Pain Score --      Pain Loc --      Pain Edu? --      Excl. in [place]? --    No data found.  Updated Vital Signs BP 110/68 (BP Location: Left Arm)   Pulse 72   Temp 98.3 F (36.8 C) (Oral)   Resp 16   Ht 5\' 1"  (1.549 m)   Wt 195 lb (88.5 kg)   SpO2 99%   BMI 36.84 kg/m   Visual Acuity Right Eye Distance:   Left Eye Distance:   Bilateral Distance:    Right Eye Near:   Left Eye Near:    Bilateral Near:     Physical Exam Skin examination does reveal several areas to bilateral upper extremities, bilateral lower extremities in addition her trunk where  there is a red raised rash without areas of weeping.  It does appear to be contact dermatitis  base.  She has several areas along her lower extremities as well.  There does appear to be possibly a developing yeast infection to the groin bilaterally.  The patient does have some swelling of her lips at today's visit, no erythema to the posterior oropharynx.  No swelling in the back of her throat.  No wheeze to bilateral lung fields.  UC Treatments / Results  Labs (all labs ordered are listed, but only abnormal results are displayed) Labs Reviewed - No data to display  EKG   Radiology No results found.  Procedures Procedures (including critical care time)  Medications Ordered in UC Medications - No data to display  Initial Impression / Assessment and Plan / UC Course  I have reviewed the triage vital signs and the nursing notes.  Pertinent labs & imaging results that were available during my care of the patient were reviewed by me and considered in my medical decision making (see chart for details).     1.  Treatment options were discussed today with the patient. 2.  The patient was given a Medrol Dosepak, she is a diabetic so she was instructed to monitor her blood sugar levels closely. 3.  The patient was also given hydrocortisone cream to apply twice daily in addition to as needed Benadryl for itching.   4.  Instructed the patient that if she has not improved by Wednesday to follow-up with her primary care physician. Final Clinical Impressions(s) / UC Diagnoses   Final diagnoses:  Contact dermatitis, unspecified contact dermatitis type, unspecified trigger     Discharge Instructions     -Take medications as directed. -Benadryl as needed for itching. -Follow-up with PCP if no improvement or worsening symptoms   ED Prescriptions    Medication Sig Dispense Auth. Provider   methylPREDNISolone (MEDROL DOSEPAK) 4 MG TBPK tablet Take per package instructions 21 tablet Lattie Corns, PA-C   hydrocortisone 2.5 % lotion Apply topically 2 (two) times daily. 59 mL  Lattie Corns, PA-C     PDMP not reviewed this encounter.   Lattie Corns, PA-C 04/05/20 1507

## 2020-04-05 NOTE — ED Triage Notes (Signed)
Patient c/o rash / itching all over her body this morning. Patient not sure if allergic reaction to something.

## 2020-04-05 NOTE — Discharge Instructions (Signed)
-  Take medications as directed. -Benadryl as needed for itching. -Follow-up with PCP if no improvement or worsening symptoms

## 2020-07-05 ENCOUNTER — Encounter: Payer: Self-pay | Admitting: Nurse Practitioner

## 2020-07-05 DIAGNOSIS — M85851 Other specified disorders of bone density and structure, right thigh: Secondary | ICD-10-CM | POA: Insufficient documentation

## 2020-07-05 DIAGNOSIS — M8580 Other specified disorders of bone density and structure, unspecified site: Secondary | ICD-10-CM | POA: Insufficient documentation

## 2020-07-09 ENCOUNTER — Ambulatory Visit (INDEPENDENT_AMBULATORY_CARE_PROVIDER_SITE_OTHER): Payer: Managed Care, Other (non HMO) | Admitting: Nurse Practitioner

## 2020-07-09 ENCOUNTER — Other Ambulatory Visit: Payer: Self-pay

## 2020-07-09 ENCOUNTER — Encounter: Payer: Self-pay | Admitting: Nurse Practitioner

## 2020-07-09 VITALS — BP 99/63 | HR 57 | Temp 98.4°F | Ht 62.09 in | Wt 186.2 lb

## 2020-07-09 DIAGNOSIS — E1169 Type 2 diabetes mellitus with other specified complication: Secondary | ICD-10-CM

## 2020-07-09 DIAGNOSIS — E669 Obesity, unspecified: Secondary | ICD-10-CM

## 2020-07-09 DIAGNOSIS — I152 Hypertension secondary to endocrine disorders: Secondary | ICD-10-CM

## 2020-07-09 DIAGNOSIS — E785 Hyperlipidemia, unspecified: Secondary | ICD-10-CM

## 2020-07-09 DIAGNOSIS — E890 Postprocedural hypothyroidism: Secondary | ICD-10-CM

## 2020-07-09 DIAGNOSIS — E1159 Type 2 diabetes mellitus with other circulatory complications: Secondary | ICD-10-CM | POA: Diagnosis not present

## 2020-07-09 DIAGNOSIS — M85851 Other specified disorders of bone density and structure, right thigh: Secondary | ICD-10-CM

## 2020-07-09 DIAGNOSIS — E6609 Other obesity due to excess calories: Secondary | ICD-10-CM

## 2020-07-09 DIAGNOSIS — E66811 Other obesity due to excess calories: Secondary | ICD-10-CM

## 2020-07-09 DIAGNOSIS — Z6833 Body mass index (BMI) 33.0-33.9, adult: Secondary | ICD-10-CM

## 2020-07-09 DIAGNOSIS — E538 Deficiency of other specified B group vitamins: Secondary | ICD-10-CM

## 2020-07-09 LAB — BAYER DCA HB A1C WAIVED: HB A1C (BAYER DCA - WAIVED): 6.1 % (ref <7.0)

## 2020-07-09 LAB — MICROALBUMIN, URINE WAIVED
Creatinine, Urine Waived: 50 mg/dL (ref 10–300)
Microalb, Ur Waived: 10 mg/L (ref 0–19)

## 2020-07-09 NOTE — Assessment & Plan Note (Signed)
Chronic, stable.  A1C 6.1% today and urine ALB 10.  Continue current medication regimen and blood sugar checks at home.  Return in 6 months.

## 2020-07-09 NOTE — Assessment & Plan Note (Signed)
Chronic, stable.  Continue current medication regimen and adjust as needed.  Lipid panel today. 

## 2020-07-09 NOTE — Assessment & Plan Note (Addendum)
BMI 33.96.  Praised for weight loss. Recommended eating smaller high protein, low fat meals more frequently and exercising 30 mins a day 5 times a week with a goal of 10-15lb weight loss in the next 3 months. Patient voiced their understanding and motivation to adhere to these recommendations.

## 2020-07-09 NOTE — Progress Notes (Addendum)
BP 99/63   Pulse (!) 57   Temp 98.4 F (36.9 C) (Oral)   Ht 5' 2.09" (1.577 m)   Wt 186 lb 3.2 oz (84.5 kg)   SpO2 98%   BMI 33.96 kg/m    Subjective:    Patient ID: Susan Pineda, female    DOB: [birth year], 65 y.o.   MRN: [id number]  HPI: Susan Pineda is a 65 y.o. female  Chief Complaint  Patient presents with  . Diabetes  . Hyperlipidemia   DIABETES Continues on Metformin 500 MG BID. Last A1C July was 6.1%. Hypoglycemic episodes:no Polydipsia/polyuria:no Visual disturbance:no Chest pain:no Paresthesias:no Glucose Monitoring:yes Accucheck frequency: rarely Fasting glucose: yesterday 92 Post prandial: Evening: Before meals: Taking Insulin?:no Long acting insulin: Short acting insulin: Blood Pressure Monitoring:not checking Retinal Examination:Up To Date Foot Exam:Up to Date Pneumovax:Had PPSV23 in 2011 Influenza:Up to Date Aspirin:yes  HYPERTENSION / HYPERLIPIDEMIA Continues on Lisinopril 5 MG daily and Pravastatin 40 MG. Satisfied with current treatment?yes Duration of hypertension:chronic BP monitoring frequency:not checking BP range:  BP medication side effects:no Duration of hyperlipidemia:chronic Cholesterol medication side effects:no Cholesterol supplements: none Medication compliance:good compliance Aspirin:yes Recent stressors:no Recurrent headaches:no Visual changes:no Palpitations:no Dyspnea:no Chest pain:no Lower extremity edema:no Dizzy/lightheaded:no  HYPOTHYROIDISM Followed by Dr. Gabriel Carina, continues on Levothyroxine 88 MCG. Had thyroid removed in 2012.  Sees Dr. Gabriel Carina on 07/29/20, last visit with them 07/30/19. Satisfied with current treatment?yes Medication side effects:no Medication compliance:good compliance Etiology of hypothyroidism:  Recent dose adjustment:no Fatigue:no Cold intolerance:no Heat  intolerance:no Weight gain:no Weight loss:no Constipation:no Diarrhea/loose stools:no Palpitations:no Lower extremity edema:no Anxiety/depressed mood:no  OSTEOPENIA Last DEXA 08/27/2014.  The BMD measured at Femur Neck Right is 0.753 g/cm2 with a T-score of -2.1.  Adequate calcium & vitamin D: no -- not currently taking Weight bearing exercises: no  Relevant past medical, surgical, family and social history reviewed and updated as indicated. Interim medical history since our last visit reviewed. Allergies and medications reviewed and updated.  Review of Systems  Constitutional: Negative for activity change, appetite change, diaphoresis, fatigue and fever.  Respiratory: Negative for cough, chest tightness and shortness of breath.   Cardiovascular: Negative for chest pain, palpitations and leg swelling.  Gastrointestinal: Negative.  Negative for vomiting.  Endocrine: Negative for cold intolerance, heat intolerance, polydipsia, polyphagia and polyuria.  Neurological: Negative.   Psychiatric/Behavioral: Negative.     Per HPI unless specifically indicated above     Objective:    BP 99/63   Pulse (!) 57   Temp 98.4 F (36.9 C) (Oral)   Ht 5' 2.09" (1.577 m)   Wt 186 lb 3.2 oz (84.5 kg)   SpO2 98%   BMI 33.96 kg/m   Wt Readings from Last 3 Encounters:  07/09/20 186 lb 3.2 oz (84.5 kg)  04/05/20 195 lb (88.5 kg)  01/07/20 194 lb 3.2 oz (88.1 kg)    Physical Exam Vitals and nursing note reviewed.  Constitutional:      General: She is awake. She is not in acute distress.    Appearance: She is well-developed and well-groomed. She is obese. She is not ill-appearing.  HENT:     Head: Normocephalic.     Right Ear: Hearing normal.     Left Ear: Hearing normal.  Eyes:     General: Lids are normal.        Right eye: No discharge.        Left eye: No discharge.     Conjunctiva/sclera: Conjunctivae normal.  Pupils: Pupils are equal, round, and reactive to light.   Neck:     Thyroid: No thyromegaly.     Vascular: No carotid bruit.  Cardiovascular:     Rate and Rhythm: Normal rate and regular rhythm.     Heart sounds: Normal heart sounds. No murmur heard. No gallop.   Pulmonary:     Effort: Pulmonary effort is normal. No accessory muscle usage or respiratory distress.     Breath sounds: Normal breath sounds.  Abdominal:     General: Bowel sounds are normal.     Palpations: Abdomen is soft.  Musculoskeletal:     Cervical back: Normal range of motion and neck supple.     Right lower leg: No edema.     Left lower leg: No edema.  Lymphadenopathy:     Cervical: No cervical adenopathy.  Skin:    General: Skin is warm and dry.  Neurological:     Mental Status: She is alert and oriented to person, place, and time.  Psychiatric:        Attention and Perception: Attention normal.        Mood and Affect: Mood normal.        Speech: Speech normal.        Behavior: Behavior normal. Behavior is cooperative.        Thought Content: Thought content normal.    Diabetic Foot Exam - Simple   Simple Foot Form Visual Inspection No deformities, no ulcerations, no other skin breakdown bilaterally: Yes Sensation Testing Intact to touch and monofilament testing bilaterally: Yes Pulse Check Posterior Tibialis and Dorsalis pulse intact bilaterally: Yes Comments    Results for orders placed or performed in visit on 02/19/20  CBC with Differential/Platelet  Result Value Ref Range   WBC 10.6 3.4 - 10.8 x10E3/uL   RBC 5.03 3.77 - 5.28 x10E6/uL   Hemoglobin 15.2 11.1 - 15.9 g/dL   Hematocrit 45.7 34.0 - 46.6 %   MCV 91 79 - 97 fL   MCH 30.2 26.6 - 33.0 pg   MCHC 33.3 31.5 - 35.7 g/dL   RDW 13.0 11.7 - 15.4 %   Platelets 372 150 - 450 x10E3/uL   Neutrophils 63 Not Estab. %   Lymphs 21 Not Estab. %   Monocytes 12 Not Estab. %   Eos 2 Not Estab. %   Basos 1 Not Estab. %   Neutrophils Absolute 6.8 1.4 - 7.0 x10E3/uL   Lymphocytes Absolute 2.2 0.7 - 3.1  x10E3/uL   Monocytes Absolute 1.3 (H) 0.1 - 0.9 x10E3/uL   EOS (ABSOLUTE) 0.3 0.0 - 0.4 x10E3/uL   Basophils Absolute 0.1 0.0 - 0.2 x10E3/uL   Immature Granulocytes 1 Not Estab. %   Immature Grans (Abs) 0.1 0.0 - 0.1 x10E3/uL      Assessment & Plan:   Problem List Items Addressed This Visit      Cardiovascular and Mediastinum   Hypertension associated with diabetes ([place])    Chronic, stable with BP at goal in office, well below goal.  Could consider cutting back on Lisinopril at future visits to 2.5 MG if maintains tight control.  Would like to keep on board for kidney protection.  Recommend she monitor BP at least a few mornings a week at home and document.  DASH diet at home.  Labs today.  Return in 6 months.       Relevant Orders   Microalbumin, Urine Waived     Endocrine   Type 2 diabetes mellitus  with obesity ([facility]) - Primary    Chronic, stable.  A1C 6.1% today and urine ALB 10.  Continue current medication regimen and blood sugar checks at home.  Return in 6 months.        Relevant Orders   Bayer DCA Hb A1c Waived (STAT)   Basic metabolic panel   Microalbumin, Urine Waived   Hyperlipidemia associated with type 2 diabetes mellitus ([facility])    Chronic, stable.  Continue current medication regimen and adjust as needed.  Lipid panel today.       Relevant Orders   Lipid Panel w/o Chol/HDL Ratio   Postprocedural hypothyroidism    History of thyroid cancer with postprocedural hypothyroidism.  Continue current medication regimen and collaboration with Dr. Tedd Sias.  TSH upcoming with endo.        Musculoskeletal and Integument   Osteopenia    Noted on scan 08/27/14.  Repeat DEXA at 66.  Check Vit D level today and start supplements at home.      Relevant Orders   VITAMIN D 25 Hydroxy (Vit-D Deficiency, Fractures)     Other   Obesity    BMI 33.96.  Praised for weight loss. Recommended eating smaller high protein, low fat meals more frequently and exercising 30 mins a day 5  times a week with a goal of 10-15lb weight loss in the next 3 months. Patient voiced their understanding and motivation to adhere to these recommendations.        Other Visit Diagnoses    B12 deficiency       B12 level today, reports history of low levels.  Start supplement as needed.   Relevant Orders   Vitamin B12       Follow up plan: Return in about 6 months (around 01/06/2021) for Annual physical.

## 2020-07-09 NOTE — Assessment & Plan Note (Signed)
Noted on scan 08/27/14.  Repeat DEXA at 66.  Check Vit D level today and start supplements at home.

## 2020-07-09 NOTE — Patient Instructions (Addendum)
Your bone density shows thinning bones (osteopenia) but not brittle (osteoporosis).  We recommend Vitamin D supplementation of about 2,0000 IUs of over the counter Vitamin D3.  In addition, we recommend a diet high in calcium with dairy and dark green leafy vegetables.  We would like you to get plenty of weight bearing exercises with walking and resistance training such as light weights or resistance bands available with instructions at places such as [employer].     Osteopenia  Osteopenia is a loss of thickness (density) inside of the bones. Another name for osteopenia is low bone mass. Mild osteopenia is a normal part of aging. It is not a disease, and it does not cause symptoms. However, if you have osteopenia and continue to lose bone mass, you could develop a condition that causes the bones to become thin and break more easily (osteoporosis). You may also lose some height, have back pain, and have a stooped posture. Although osteopenia is not a disease, making changes to your lifestyle and diet can help to prevent osteopenia from developing into osteoporosis. What are the causes? Osteopenia is caused by loss of calcium in the bones.  Bones are constantly changing. Old bone cells are continually being replaced with new bone cells. This process builds new bone. The mineral calcium is needed to build new bone and maintain bone density. Bone density is usually highest around age 35. After that, most people's bodies cannot replace all the bone they have lost with new bone. What increases the risk? You are more likely to develop this condition if:  You are older than age 50.  You are a woman who went through menopause early.  You have a long illness that keeps you in bed.  You do not get enough exercise.  You lack certain nutrients (malnutrition).  You have an overactive thyroid gland (hyperthyroidism).  You smoke.  You drink a lot of alcohol.  You are taking medicines that weaken the bones,  such as steroids. What are the signs or symptoms? This condition does not cause any symptoms. You may have a slightly higher risk for bone breaks (fractures), so getting fractures more easily than normal may be an indication of osteopenia. How is this diagnosed? Your health care provider can diagnose this condition with a special type of X-ray exam that measures bone density (dual-energy X-ray absorptiometry, DEXA). This test can measure bone density in your hips, spine, and wrists. Osteopenia has no symptoms, so this condition is usually diagnosed after a routine bone density screening test is done for osteoporosis. This routine screening is usually done for:  Women who are age 65 or older.  Men who are age 70 or older. If you have risk factors for osteopenia, you may have the screening test at an earlier age. How is this treated? Making dietary and lifestyle changes can lower your risk for osteoporosis. If you have severe osteopenia that is close to becoming osteoporosis, your health care provider may prescribe medicines and dietary supplements such as calcium and vitamin D. These supplements help to rebuild bone density. Follow these instructions at home:   Take over-the-counter and prescription medicines only as told by your health care provider. These include vitamins and supplements.  Eat a diet that is high in calcium and vitamin D. ? Calcium is found in dairy products, beans, salmon, and leafy green vegetables like spinach and broccoli. ? Look for foods that have vitamin D and calcium added to them (fortified foods), such as orange juice,   cereal, and bread.  Do 30 or more minutes of a weight-bearing exercise every day, such as walking, jogging, or playing a sport. These types of exercises strengthen the bones.  Take precautions at home to lower your risk of falling, such as: ? Keeping rooms well-lit and free of clutter, such as cords. ? Installing safety rails on stairs. ? Using  rubber mats in the bathroom or other areas that are often wet or slippery.  Do not use any products that contain nicotine or tobacco, such as cigarettes and e-cigarettes. If you need help quitting, ask your health care provider.  Avoid alcohol or limit alcohol intake to no more than 1 drink a day for nonpregnant women and 2 drinks a day for men. One drink equals 12 oz of beer, 5 oz of wine, or 1 oz of hard liquor.  Keep all follow-up visits as told by your health care provider. This is important. Contact a health care provider if:  You have not had a bone density screening for osteoporosis and you are: ? A woman, age 65 or older. ? A man, age 70 or older.  You are a postmenopausal woman who has not had a bone density screening for osteoporosis.  You are older than age 50 and you want to know if you should have bone density screening for osteoporosis. Summary  Osteopenia is a loss of thickness (density) inside of the bones. Another name for osteopenia is low bone mass.  Osteopenia is not a disease, but it may increase your risk for a condition that causes the bones to become thin and break more easily (osteoporosis).  You may be at risk for osteopenia if you are older than age 50 or if you are a woman who went through early menopause.  Osteopenia does not cause any symptoms, but it can be diagnosed with a bone density screening test.  Dietary and lifestyle changes are the first treatment for osteopenia. These may lower your risk for osteoporosis. This information is not intended to replace advice given to you by your health care provider. Make sure you discuss any questions you have with your health care provider. Document Revised: 06/02/2017 Document Reviewed: 03/29/2017 Elsevier Patient Education  2020 [employer]  

## 2020-07-09 NOTE — Assessment & Plan Note (Signed)
Chronic, stable with BP at goal in office, well below goal.  Could consider cutting back on Lisinopril at future visits to 2.5 MG if maintains tight control.  Would like to keep on board for kidney protection.  Recommend she monitor BP at least a few mornings a week at home and document.  DASH diet at home.  Labs today.  Return in 6 months.

## 2020-07-09 NOTE — Assessment & Plan Note (Signed)
History of thyroid cancer with postprocedural hypothyroidism.  Continue current medication regimen and collaboration with Dr. Tedd Sias.  TSH upcoming with endo.

## 2020-07-11 ENCOUNTER — Encounter: Payer: Self-pay | Admitting: Nurse Practitioner

## 2020-07-11 DIAGNOSIS — E559 Vitamin D deficiency, unspecified: Secondary | ICD-10-CM | POA: Insufficient documentation

## 2020-07-11 LAB — LIPID PANEL
Chol/HDL Ratio: 3 ratio (ref 0.0–4.4)
Cholesterol, Total: 125 mg/dL (ref 100–199)
HDL: 41 mg/dL (ref >39)
LDL Chol Calc (NIH): 67 mg/dL (ref 0–99)
Triglycerides: 85 mg/dL (ref 0–149)
VLDL Cholesterol Cal: 17 mg/dL (ref 5–40)

## 2020-07-11 LAB — BASIC METABOLIC PANEL
BUN/Creatinine Ratio: 19 (ref 12–28)
BUN: 14 mg/dL (ref 8–27)
CO2: 23 mmol/L (ref 20–29)
Calcium: 9.5 mg/dL (ref 8.7–10.3)
Chloride: 97 mmol/L (ref 96–106)
Creatinine, Ser: 0.73 mg/dL (ref 0.57–1.00)
GFR calc Af Amer: 101 mL/min/{1.73_m2} (ref >59)
GFR calc non Af Amer: 88 mL/min/{1.73_m2} (ref >59)
Glucose: 89 mg/dL (ref 65–99)
Potassium: 4.6 mmol/L (ref 3.5–5.2)
Sodium: 137 mmol/L (ref 134–144)

## 2020-07-11 LAB — VITAMIN B12: Vitamin B-12: 1462 pg/mL — ABNORMAL HIGH (ref 232–1245)

## 2020-07-11 LAB — VITAMIN D 25 HYDROXY (VIT D DEFICIENCY, FRACTURES): Vit D, 25-Hydroxy: 20.6 ng/mL — ABNORMAL LOW (ref 30.0–100.0)

## 2020-07-11 NOTE — Progress Notes (Signed)
Contacted via [place] evening Alfie, your labs have returned.  Overall everything looks great with exception of your Vitamin D level, this is important for bone health, with your osteopenia I recommend you start taking Vitamin D3 2000 units by mouth daily and also ensure good calcium intake.  Any questions?  Will recheck this next visit. Keep being awesome!!  Thank you for allowing me to participate in your care. Kindest regards, Jeovani Weisenburger

## 2020-10-30 ENCOUNTER — Other Ambulatory Visit: Payer: Self-pay

## 2020-10-30 ENCOUNTER — Ambulatory Visit (INDEPENDENT_AMBULATORY_CARE_PROVIDER_SITE_OTHER): Payer: Managed Care, Other (non HMO)

## 2020-10-30 ENCOUNTER — Ambulatory Visit
Admission: EM | Admit: 2020-10-30 | Discharge: 2020-10-30 | Disposition: A | Discharge status: Home / Self Care | Payer: Managed Care, Other (non HMO) | Attending: Physician Assistant | Admitting: Physician Assistant

## 2020-10-30 DIAGNOSIS — M25561 Pain in right knee: Secondary | ICD-10-CM | POA: Diagnosis not present

## 2020-10-30 DIAGNOSIS — M1710 Unilateral primary osteoarthritis, unspecified knee: Secondary | ICD-10-CM

## 2020-10-30 MED ORDER — TRAMADOL HCL 50 MG PO TABS: 50.0000 mg | ORAL_TABLET | Freq: Four times a day (QID) | ORAL | 0 refills | Status: AC | PRN

## 2020-10-30 MED ORDER — MELOXICAM 15 MG PO TABS: 15.0000 mg | ORAL_TABLET | Freq: Every day | ORAL | 0 refills | Status: AC

## 2020-10-30 NOTE — Discharge Instructions (Signed)
Your x-ray shows you have some arthritis in the knee.  You likely have a flareup of the underlying arthritis.  There is also possibility of a synovial cyst or Baker's cyst behind the knee.  These are often secondary to arthritis.  At this time we have given you a stabilizing knee brace.  I advised rest, ice, elevation (can put pillow under knee), and use of the brace.  I have sent meloxicam which is an anti-inflammatory to the pharmacy.  You can also take Tylenol if needed for pain.  Sent a little bit stronger medication for pain called tramadol.  This is a low potency opioid so be careful to take this at home.  You may not want to take this when you are working as it can make you drowsy.  I have given you a work note as well.  If your symptoms are not getting better the next couple weeks or if they worsen you should follow-up with Ortho.  You have a condition requiring you to follow up with Orthopedics so please call one of the following office for appointment:   [facility] [address] [place], [place] [address] Phone: [phone]  [facility] [address] [place], [place] [address] Phone: [phone]

## 2020-10-30 NOTE — ED Provider Notes (Signed)
[facility]    CSN: [id number] Arrival date & time: 10/30/20  1502      History   Chief Complaint Chief Complaint  Patient presents with  . Knee Pain    right    HPI Susan Pineda is a 65 y.o. female presenting for right knee discomfort for the past few weeks.  Patient states that it did not really start to become painful until today.  She denies any injury.  She says that she has had constant aching pain today that is worse whenever she walks or tries to go up or down stairs.  Pain improves when she is resting.  She admits to most of the pain of the posterior knee.  States that sometimes she has sharp pains of the lateral knee.  Has had some slight numbness of the lateral lower leg that comes and goes.  Admits to feeling some weakness of the knee and like it is going to give out.  No swelling, bruising or erythema.  Good range of motion without any increased pain.  No fevers.  Patient denies any history of knee conditions.  Patient says that she works at [employer] and is on her feet all the time.  She is worried about having to work the next 24 hours straight.  She has taken ibuprofen with some improvement in her pain about 4 hours ago but states that she believes it starting to wear off now.  She has no other concerns.  HPI  Past Medical History:  Diagnosis Date  . Anemia    h/o  . Cancer ([place]) 2012   THYROID CA  . Chronic kidney disease    KIDNEY STONES  . Diabetes mellitus without complication ([place])   . Diverticulitis   . GERD (gastroesophageal reflux disease)   . Hyperlipidemia   . Hypertension   . Hypothyroidism     Patient Active Problem List   Diagnosis Date Noted  . Vitamin D deficiency 07/11/2020  . Osteopenia 07/05/2020  . Obesity 01/07/2020  . S/P hysterectomy 09/21/2016  . 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 Recurrent ventral hernia 03/11/2015  . Hypertension associated with diabetes ([place]) 03/04/2015  . Type 2 diabetes mellitus with obesity  ([place]) 03/04/2015  . Hyperlipidemia associated with type 2 diabetes mellitus ([place]) 03/04/2015  . Hx of submandibular gland removal 12/18/2014    Past Surgical History:  Procedure Laterality Date  . ABDOMINAL HYSTERECTOMY  1999  . HERNIA REPAIR    . HERNIA REPAIR  2012, 2013   Dr Rochel Brome  . LITHOTRIPSY     X2  . PARATHYROIDECTOMY Right   . 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 SUBMANDIBULAR GLAND EXCISION Right 12/18/2014   Procedure: Right submandibular gland and subligual gland excision;  Surgeon: Carloyn Manner, MD;  Location: [facility] ORS;  Service: ENT;  Laterality: Right;  . THYROIDECTOMY    . VENTRAL HERNIA REPAIR N/A 06/02/2015   Procedure: HERNIA REPAIR VENTRAL ADULT, laparoscopic;  Surgeon: Robert Bellow, MD;  Location: [facility] ORS;  Service: General;  Laterality: N/A;    OB History    Gravida  3   Para  3   Term      Preterm      AB      Living        SAB      IAB      Ectopic      Multiple      Live Births  Obstetric Comments  1st Menstrual Cycle:  13  1st Pregnancy:  18         Home Medications    Prior to Admission medications   Medication Sig Start Date End Date Taking? Authorizing Provider  aspirin EC 81 MG tablet Take 81 mg by mouth daily.   Yes [provider]  Cinnamon 500 MG TABS Take 1 tablet by mouth daily.   Yes [provider]  Cranberry 1000 MG CAPS Take 1 capsule by mouth daily.   Yes [provider]  hydrocortisone 2.5 % lotion Apply topically 2 (two) times daily. 04/05/20  Yes Lattie Corns, PA-C  levothyroxine (SYNTHROID, LEVOTHROID) 88 MCG tablet Take 88 mcg by mouth daily. 01/10/17  Yes [provider]  lisinopril (ZESTRIL) 5 MG tablet Take 1 tablet (5 mg total) by mouth daily. 01/07/20  Yes Cannady, Jolene T, NP  meloxicam (MOBIC) 15 MG tablet Take 1 tablet (15 mg total) by mouth daily. 10/30/20 11/29/20 Yes Danton Clap, PA-C  metFORMIN  (GLUCOPHAGE) 500 MG tablet Take 1 tablet (500 mg total) by mouth 2 (two) times daily. 01/07/20  Yes Cannady, Henrine Screws T, NP  Microlet Lancets MISC 1 each by Does not apply route daily as needed. 01/07/20  Yes Cannady, Jolene T, NP  pravastatin (PRAVACHOL) 40 MG tablet Take 1 tablet (40 mg total) by mouth at bedtime. 01/07/20  Yes Cannady, Jolene T, NP  traMADol (ULTRAM) 50 MG tablet Take 1 tablet (50 mg total) by mouth every 6 (six) hours as needed for up to 5 days. 10/30/20 11/04/20 Yes Danton Clap, PA-C    Family History Family History  Problem Relation Age of Onset  . Cancer Mother   . Cancer Father   . Breast cancer Neg Hx     Social History Social History   Tobacco Use  . Smoking status: Former Smoker    Packs/day: 0.25    Years: 42.00    Pack years: 10.50    Types: Cigarettes    Quit date: 08/05/2015    Years since quitting: 5.2  . Smokeless tobacco: Never Used  Vaping Use  . Vaping Use: Never used  Substance Use Topics  . Alcohol use: No    Alcohol/week: 0.0 standard drinks  . Drug use: No     Allergies   Patient has no known allergies.   Review of Systems Review of Systems  Constitutional: Negative for fatigue and fever.  Musculoskeletal: Positive for arthralgias and gait problem. Negative for joint swelling.  Skin: Negative for color change, rash and wound.  Neurological: Negative for weakness and numbness.     Physical Exam Triage Vital Signs ED Triage Vitals  Enc Vitals Group     BP 10/30/20 1522 120/71     Pulse Rate 10/30/20 1522 64     Resp 10/30/20 1522 18     Temp 10/30/20 1522 98.2 F (36.8 C)     Temp Source 10/30/20 1522 Oral     SpO2 10/30/20 1522 99 %     Weight 10/30/20 1521 185 lb (83.9 kg)     Height 10/30/20 1521 5\' 2"  (1.575 m)     Head Circumference --      Peak Flow --      Pain Score 10/30/20 1521 10     Pain Loc --      Pain Edu? --      Excl. in [place]? --    No data found.  Updated Vital Signs BP  120/71 (BP Location: Left Arm)    Pulse 64   Temp 98.2 F (36.8 C) (Oral)   Resp 18   Ht 5\' 2"  (1.575 m)   Wt 185 lb (83.9 kg)   SpO2 99%   BMI 33.84 kg/m       Physical Exam Vitals and nursing note reviewed.  Constitutional:      General: She is not in acute distress.    Appearance: Normal appearance. She is not ill-appearing or toxic-appearing.  HENT:     Head: Normocephalic and atraumatic.  Eyes:     General: No scleral icterus.       Right eye: No discharge.        Left eye: No discharge.     Conjunctiva/sclera: Conjunctivae normal.  Cardiovascular:     Rate and Rhythm: Normal rate and regular rhythm.     Pulses: Normal pulses.     Heart sounds: Normal heart sounds.  Pulmonary:     Effort: Pulmonary effort is normal. No respiratory distress.     Breath sounds: Normal breath sounds.  Musculoskeletal:     Cervical back: Neck supple.     Comments: LEFT KNEE: Mild swelling posterior knee. No TTP of any part of knee. Good flexion and extension. No calf tenderness. 5/5 strength  Skin:    General: Skin is dry.  Neurological:     General: No focal deficit present.     Mental Status: She is alert. Mental status is at baseline.     Motor: No weakness.     Gait: Gait abnormal (guarding and slight limp).  Psychiatric:        Mood and Affect: Mood normal.        Behavior: Behavior normal.        Thought Content: Thought content normal.      UC Treatments / Results  Labs (all labs ordered are listed, but only abnormal results are displayed) Labs Reviewed - No data to display  EKG   Radiology DG Knee Complete 4 Views Right  Result Date: 10/30/2020 CLINICAL DATA:  Right knee pain intermittently for several weeks, pain with ambulation EXAM: RIGHT KNEE - COMPLETE 4+ VIEW COMPARISON:  06/22/2005 FINDINGS: Frontal, oblique, lateral, and sunrise views of the right knee are obtained. Mild osteoarthritis of the patellofemoral and medial compartments. No fracture, subluxation, or dislocation. No joint  effusion. IMPRESSION: 1. Mild medial and patellofemoral compartmental osteoarthritis. No acute bony abnormality. Electronically Signed   By: Randa Ngo M.D.   On: 10/30/2020 16:37    Procedures Procedures (including critical care time)  Medications Ordered in UC Medications - No data to display  Initial Impression / Assessment and Plan / UC Course  I have reviewed the triage vital signs and the nursing notes.  Pertinent labs & imaging results that were available during my care of the patient were reviewed by me and considered in my medical decision making (see chart for details).   65 year old female presenting for right knee pain.  X-ray obtained today shows mild medial and patellofemoral compartmental osteoarthritis.  Reviewed this result with patient.  I suspect her knee pain is likely related to flareup of underlying arthritis but there could also be a Baker's cyst or synovial cyst.  Supportive care encouraged at this time with [facility].  Sent meloxicam and Ultram to pharmacy.  Controlled substance database reviewed.  I did give her a work note for the next couple of days for lighter duty.  Encouraged her to follow-up with  orthopedics if not improving over the next couple of weeks or symptoms worsen.   Final Clinical Impressions(s) / UC Diagnoses   Final diagnoses:  Acute pain of right knee  Arthritis of knee     Discharge Instructions     Your x-ray shows you have some arthritis in the knee.  You likely have a flareup of the underlying arthritis.  There is also possibility of a synovial cyst or Baker's cyst behind the knee.  These are often secondary to arthritis.  At this time we have given you a stabilizing knee brace.  I advised rest, ice, elevation (can put pillow under knee), and use of the brace.  I have sent meloxicam which is an anti-inflammatory to the pharmacy.  You can also take Tylenol if needed for pain.  Sent a little bit stronger medication for pain called tramadol.   This is a low potency opioid so be careful to take this at home.  You may not want to take this when you are working as it can make you drowsy.  I have given you a work note as well.  If your symptoms are not getting better the next couple weeks or if they worsen you should follow-up with Ortho.  You have a condition requiring you to follow up with Orthopedics so please call one of the following office for appointment:   [facility] [address] [place], [place] [address] Phone: [phone]  [facility] [address] [place], [place] [address] Phone: [phone]     ED Prescriptions    Medication Sig Dispense Auth. Provider   meloxicam (MOBIC) 15 MG tablet Take 1 tablet (15 mg total) by mouth daily. 30 tablet Laurene Footman B, PA-C   traMADol (ULTRAM) 50 MG tablet Take 1 tablet (50 mg total) by mouth every 6 (six) hours as needed for up to 5 days. 15 tablet Danton Clap, PA-C     I have reviewed the PDMP during this encounter.   Danton Clap, PA-C 10/30/20 1656

## 2020-10-30 NOTE — ED Triage Notes (Signed)
Pt c/o right knee pain off and on for several weeks. Pt reports increased pain with ambulation. Pt denies any known injury, swelling or other issues. Pt does have full ROM to the knee and is able to drive. Pt is concerned about working this weekend because she is on her feet.

## 2020-12-22 LAB — HM DIABETES EYE EXAM

## 2020-12-24 ENCOUNTER — Other Ambulatory Visit: Payer: Self-pay | Admitting: Nurse Practitioner

## 2020-12-24 DIAGNOSIS — Z1231 Encounter for screening mammogram for malignant neoplasm of breast: Secondary | ICD-10-CM

## 2021-01-05 ENCOUNTER — Other Ambulatory Visit: Payer: Self-pay

## 2021-01-05 ENCOUNTER — Ambulatory Visit
Admission: RE | Admit: 2021-01-05 | Discharge: 2021-01-05 | Disposition: A | Discharge status: Home / Self Care | Payer: Managed Care, Other (non HMO) | Source: Ambulatory Visit | Attending: Nurse Practitioner | Admitting: Nurse Practitioner

## 2021-01-05 DIAGNOSIS — Z1231 Encounter for screening mammogram for malignant neoplasm of breast: Secondary | ICD-10-CM | POA: Diagnosis not present

## 2021-01-12 ENCOUNTER — Encounter: Payer: Self-pay | Admitting: Nurse Practitioner

## 2021-01-12 ENCOUNTER — Other Ambulatory Visit: Payer: Self-pay

## 2021-01-12 ENCOUNTER — Ambulatory Visit (INDEPENDENT_AMBULATORY_CARE_PROVIDER_SITE_OTHER): Payer: Managed Care, Other (non HMO) | Admitting: Nurse Practitioner

## 2021-01-12 VITALS — BP 106/68 | HR 57 | Temp 99.0°F | Ht 61.5 in | Wt 187.4 lb

## 2021-01-12 DIAGNOSIS — Z114 Encounter for screening for human immunodeficiency virus [HIV]: Secondary | ICD-10-CM

## 2021-01-12 DIAGNOSIS — E1169 Type 2 diabetes mellitus with other specified complication: Secondary | ICD-10-CM

## 2021-01-12 DIAGNOSIS — E669 Obesity, unspecified: Secondary | ICD-10-CM

## 2021-01-12 DIAGNOSIS — E6609 Other obesity due to excess calories: Secondary | ICD-10-CM

## 2021-01-12 DIAGNOSIS — E890 Postprocedural hypothyroidism: Secondary | ICD-10-CM

## 2021-01-12 DIAGNOSIS — I152 Hypertension secondary to endocrine disorders: Secondary | ICD-10-CM

## 2021-01-12 DIAGNOSIS — M85851 Other specified disorders of bone density and structure, right thigh: Secondary | ICD-10-CM

## 2021-01-12 DIAGNOSIS — Z0000 Encounter for general adult medical examination without abnormal findings: Secondary | ICD-10-CM | POA: Diagnosis not present

## 2021-01-12 DIAGNOSIS — E1159 Type 2 diabetes mellitus with other circulatory complications: Secondary | ICD-10-CM | POA: Diagnosis not present

## 2021-01-12 DIAGNOSIS — Z6834 Body mass index (BMI) 34.0-34.9, adult: Secondary | ICD-10-CM

## 2021-01-12 DIAGNOSIS — E559 Vitamin D deficiency, unspecified: Secondary | ICD-10-CM

## 2021-01-12 DIAGNOSIS — E785 Hyperlipidemia, unspecified: Secondary | ICD-10-CM

## 2021-01-12 LAB — MICROALBUMIN, URINE WAIVED
Creatinine, Urine Waived: 50 mg/dL (ref 10–300)
Microalb, Ur Waived: 10 mg/L (ref 0–19)

## 2021-01-12 LAB — BAYER DCA HB A1C WAIVED: HB A1C (BAYER DCA - WAIVED): 5.7 % (ref <7.0)

## 2021-01-12 MED ORDER — PRAVASTATIN SODIUM 40 MG PO TABS: 40.0000 mg | ORAL_TABLET | Freq: Every day | ORAL | 4 refills | Status: DC

## 2021-01-12 MED ORDER — GLUCOSE BLOOD VI STRP: ORAL_STRIP | 12 refills | Status: DC

## 2021-01-12 MED ORDER — METFORMIN HCL 500 MG PO TABS: 500.0000 mg | ORAL_TABLET | Freq: Two times a day (BID) | ORAL | 4 refills | Status: DC

## 2021-01-12 MED ORDER — LISINOPRIL 5 MG PO TABS: 5.0000 mg | ORAL_TABLET | Freq: Every day | ORAL | 4 refills | Status: DC

## 2021-01-12 MED ORDER — MELOXICAM 15 MG PO TABS: 15.0000 mg | ORAL_TABLET | ORAL | 2 refills | Status: DC | PRN

## 2021-01-12 MED ORDER — MICROLET LANCETS MISC: 1.0000 | Freq: Every day | 4 refills | Status: DC | PRN

## 2021-01-12 NOTE — Assessment & Plan Note (Signed)
History of thyroid cancer with postprocedural hypothyroidism.  Continue current medication regimen and collaboration with Dr. Gabriel Carina.  TSH and Free T4 today.

## 2021-01-12 NOTE — Patient Instructions (Signed)
Diabetes Mellitus and Nutrition, Adult When you have diabetes, or diabetes mellitus, it is very important to have healthy eating habits because your blood sugar (glucose) levels are greatly affected by what you eat and drink. Eating healthy foods in the right amounts, at about the same times every day, can help you:  Control your blood glucose.  Lower your risk of heart disease.  Improve your blood pressure.  Reach or maintain a healthy weight. What can affect my meal plan? Every person with diabetes is different, and each person has different needs for a meal plan. Your health care provider may recommend that you work with a dietitian to make a meal plan that is best for you. Your meal plan may vary depending on factors such as:  The calories you need.  The medicines you take.  Your weight.  Your blood glucose, blood pressure, and cholesterol levels.  Your activity level.  Other health conditions you have, such as heart or kidney disease. How do carbohydrates affect me? Carbohydrates, also called carbs, affect your blood glucose level more than any other type of food. Eating carbs naturally raises the amount of glucose in your blood. Carb counting is a method for keeping track of how many carbs you eat. Counting carbs is important to keep your blood glucose at a healthy level, especially if you use insulin or take certain oral diabetes medicines. It is important to know how many carbs you can safely have in each meal. This is different for every person. Your dietitian can help you calculate how many carbs you should have at each meal and for each snack. How does alcohol affect me? Alcohol can cause a sudden decrease in blood glucose (hypoglycemia), especially if you use insulin or take certain oral diabetes medicines. Hypoglycemia can be a life-threatening condition. Symptoms of hypoglycemia, such as sleepiness, dizziness, and confusion, are similar to symptoms of having too much  alcohol.  Do not drink alcohol if: ? Your health care provider tells you not to drink. ? You are pregnant, may be pregnant, or are planning to become pregnant.  If you drink alcohol: ? Do not drink on an empty stomach. ? Limit how much you use to:  0-1 drink a day for women.  0-2 drinks a day for men. ? Be aware of how much alcohol is in your drink. In the [place] one drink equals one 12 oz bottle of beer (355 mL), one 5 oz glass of wine (148 mL), or one 1 oz glass of hard liquor (44 mL). ? Keep yourself hydrated with water, diet soda, or unsweetened iced tea.  Keep in mind that regular soda, juice, and other mixers may contain a lot of sugar and must be counted as carbs. What are tips for following this plan? Reading food labels  Start by checking the serving size on the "Nutrition Facts" label of packaged foods and drinks. The amount of calories, carbs, fats, and other nutrients listed on the label is based on one serving of the item. Many items contain more than one serving per package.  Check the total grams (g) of carbs in one serving. You can calculate the number of servings of carbs in one serving by dividing the total carbs by 15. For example, if a food has 30 g of total carbs per serving, it would be equal to 2 servings of carbs.  Check the number of grams (g) of saturated fats and trans fats in one serving. Choose foods that have   a low amount or none of these fats.  Check the number of milligrams (mg) of salt (sodium) in one serving. Most people should limit total sodium intake to less than 2,300 mg per day.  Always check the nutrition information of foods labeled as "low-fat" or "nonfat." These foods may be higher in added sugar or refined carbs and should be avoided.  Talk to your dietitian to identify your daily goals for nutrients listed on the label. Shopping  Avoid buying canned, pre-made, or processed foods. These foods tend to be high in fat, sodium, and added  sugar.  Shop around the outside edge of the grocery store. This is where you will most often find fresh fruits and vegetables, bulk grains, fresh meats, and fresh dairy. Cooking  Use low-heat cooking methods, such as baking, instead of high-heat cooking methods like deep frying.  Cook using healthy oils, such as olive, canola, or sunflower oil.  Avoid cooking with butter, cream, or high-fat meats. Meal planning  Eat meals and snacks regularly, preferably at the same times every day. Avoid going long periods of time without eating.  Eat foods that are high in fiber, such as fresh fruits, vegetables, beans, and whole grains. Talk with your dietitian about how many servings of carbs you can eat at each meal.  Eat 4-6 oz (112-168 g) of lean protein each day, such as lean meat, chicken, fish, eggs, or tofu. One ounce (oz) of lean protein is equal to: ? 1 oz (28 g) of meat, chicken, or fish. ? 1 egg. ?  cup (62 g) of tofu.  Eat some foods each day that contain healthy fats, such as avocado, nuts, seeds, and fish.   What foods should I eat? Fruits Berries. Apples. Oranges. Peaches. Apricots. Plums. Grapes. Mango. Papaya. Pomegranate. Kiwi. Cherries. Vegetables Lettuce. Spinach. Leafy greens, including kale, chard, collard greens, and mustard greens. Beets. Cauliflower. Cabbage. Broccoli. Carrots. Green beans. Tomatoes. Peppers. Onions. Cucumbers. Brussels sprouts. Grains Whole grains, such as whole-wheat or whole-grain bread, crackers, tortillas, cereal, and pasta. Unsweetened oatmeal. Quinoa. Brown or wild rice. Meats and other proteins Seafood. Poultry without skin. Lean cuts of poultry and beef. Tofu. Nuts. Seeds. Dairy Low-fat or fat-free dairy products such as milk, yogurt, and cheese. The items listed above may not be a complete list of foods and beverages you can eat. Contact a dietitian for more information. What foods should I avoid? Fruits Fruits canned with  syrup. Vegetables Canned vegetables. Frozen vegetables with butter or cream sauce. Grains Refined white flour and flour products such as bread, pasta, snack foods, and cereals. Avoid all processed foods. Meats and other proteins Fatty cuts of meat. Poultry with skin. Breaded or fried meats. Processed meat. Avoid saturated fats. Dairy Full-fat yogurt, cheese, or milk. Beverages Sweetened drinks, such as soda or iced tea. The items listed above may not be a complete list of foods and beverages you should avoid. Contact a dietitian for more information. Questions to ask a health care provider  Do I need to meet with a diabetes educator?  Do I need to meet with a dietitian?  What number can I call if I have questions?  When are the best times to check my blood glucose? Where to find more information:  American Diabetes Association: diabetes.org  Academy of Nutrition and Dietetics: www.eatright.org  National Institute of Diabetes and Digestive and Kidney Diseases: www.niddk.nih.gov  Association of Diabetes Care and Education Specialists: www.diabeteseducator.org Summary  It is important to have healthy eating   habits because your blood sugar (glucose) levels are greatly affected by what you eat and drink.  A healthy meal plan will help you control your blood glucose and maintain a healthy lifestyle.  Your health care provider may recommend that you work with a dietitian to make a meal plan that is best for you.  Keep in mind that carbohydrates (carbs) and alcohol have immediate effects on your blood glucose levels. It is important to count carbs and to use alcohol carefully. This information is not intended to replace advice given to you by your health care provider. Make sure you discuss any questions you have with your health care provider. Document Revised: 05/28/2019 Document Reviewed: 05/28/2019 Elsevier Patient Education  2021 Elsevier Inc.  

## 2021-01-12 NOTE — Assessment & Plan Note (Signed)
Noted on scan 08/27/14.  Repeat DEXA next year with her mammogram, discussed with patient and ordered.  Check Vit D level today and continue supplement.

## 2021-01-12 NOTE — Progress Notes (Signed)
BP 106/68   Pulse (!) 57   Temp 99 F (37.2 C) (Oral)   Ht 5' 1.5" (1.562 m)   Wt 187 lb 6.4 oz (85 kg)   SpO2 96%   BMI 34.84 kg/m    Subjective:    Patient ID: Susan Pineda, female    DOB: 08-02-1955, 65 y.o.   MRN: [id number]  HPI: Susan Pineda is a 65 y.o. female presenting on 01/12/2021 for comprehensive medical examination. Current medical complaints include:none  She currently lives with: self Menopausal Symptoms: no   DIABETES Continues on Metformin 500 MG BID.  Last A1C January was 6.1%.  She reports she has been bad eating cookies and ice cream. Hypoglycemic episodes:no Polydipsia/polyuria: no Visual disturbance: no Chest pain: no Paresthesias: no Glucose Monitoring: yes             Accucheck frequency: not checking             Fasting glucose:              Post prandial:             Evening:             Before meals: Taking Insulin?: no             Long acting insulin:             Short acting insulin: Blood Pressure Monitoring: not checking Retinal Examination: Up To Date  Foot Exam: Up to Date Pneumovax: Had PPSV23 in 2011 Influenza: Up to Date Aspirin: yes    HYPERTENSION / HYPERLIPIDEMIA Continues on Lisinopril 5 MG daily and Pravastatin 40 MG. Satisfied with current treatment? yes Duration of hypertension: chronic BP monitoring frequency: not checking BP range:  BP medication side effects: no Duration of hyperlipidemia: chronic Cholesterol medication side effects: no Cholesterol supplements: none Medication compliance: good compliance Aspirin: yes Recent stressors: no Recurrent headaches: no Visual changes: no Palpitations: no Dyspnea: no Chest pain: no Lower extremity edema: no Dizzy/lightheaded: no    HYPOTHYROIDISM Followed by Dr. Gabriel Carina, continues on Levothyroxine 88 MCG.  Had thyroid removed in 2012. Last saw Dr. Gabriel Carina and needs to return annually -- last seen 07/29/20.   Thyroid control status:stable Satisfied with current  treatment? yes Medication side effects: no Medication compliance: good compliance Etiology of hypothyroidism:  Recent dose adjustment:no Fatigue: no Cold intolerance: no Heat intolerance: no Weight gain: no Weight loss: no Constipation: no Diarrhea/loose stools: no Palpitations: no Lower extremity edema: no Anxiety/depressed mood: no  Depression Screen done today and results listed below:  Depression screen [facility] 2/9 07/09/2020 01/07/2020 01/02/2019 09/19/2017 03/27/2017  Decreased Interest 0 0 0 0 0  Down, Depressed, Hopeless 0 0 0 0 0  PHQ - 2 Score 0 0 0 0 0  Altered sleeping - - 0 0 -  Tired, decreased energy - - 0 0 -  Change in appetite - - 0 0 -  Feeling bad or failure about yourself  - - 0 0 -  Trouble concentrating - - 0 0 -  Moving slowly or fidgety/restless - - 0 0 -  Suicidal thoughts - - 0 0 -  PHQ-9 Score - - 0 0 -  Difficult doing work/chores - - Not difficult at all - -    The patient does not have a history of falls. I did not complete a risk assessment for falls. A plan of care for falls was not documented.   Past Medical History:  Past Medical History:  Diagnosis Date   Anemia    h/o   Cancer ([place]) 2012   THYROID CA   Chronic kidney disease    KIDNEY STONES   Diabetes mellitus without complication ([place])    Diverticulitis    GERD (gastroesophageal reflux disease)    Hyperlipidemia    Hypertension    Hypothyroidism     Surgical History:  Past Surgical History:  Procedure Laterality Date   ABDOMINAL HYSTERECTOMY  [place]     HERNIA REPAIR  2012, 2013   Dr Rochel Brome   LITHOTRIPSY     X2   PARATHYROIDECTOMY Right    SIGMOIDECTOMY  2011   colovaginal fistula/diverticulitis   SUBMANDIBULAR GLAND EXCISION Right 12/18/2014   Procedure: Right submandibular gland and subligual gland excision;  Surgeon: Carloyn Manner, MD;  Location: [facility] ORS;  Service: ENT;  Laterality: Right;   THYROIDECTOMY     VENTRAL HERNIA REPAIR N/A 06/02/2015    Procedure: HERNIA REPAIR VENTRAL ADULT, laparoscopic;  Surgeon: Robert Bellow, MD;  Location: [facility] ORS;  Service: General;  Laterality: N/A;    Medications:  Current Outpatient Medications on File Prior to Visit  Medication Sig   aspirin EC 81 MG tablet Take 81 mg by mouth daily.   Cinnamon 500 MG TABS Take 1 tablet by mouth daily.   Cranberry 1000 MG CAPS Take 1 capsule by mouth daily.   levothyroxine (SYNTHROID, LEVOTHROID) 88 MCG tablet Take 88 mcg by mouth daily.   lisinopril (ZESTRIL) 5 MG tablet Take 1 tablet (5 mg total) by mouth daily.   meloxicam (MOBIC) 15 MG tablet Take 15 mg by mouth daily.   metFORMIN (GLUCOPHAGE) 500 MG tablet Take 1 tablet (500 mg total) by mouth 2 (two) times daily.   Microlet Lancets MISC 1 each by Does not apply route daily as needed.   pravastatin (PRAVACHOL) 40 MG tablet Take 1 tablet (40 mg total) by mouth at bedtime.   hydrocortisone 2.5 % lotion Apply topically 2 (two) times daily. (Patient not taking: Reported on 01/12/2021)   No current facility-administered medications on file prior to visit.    Allergies:  No Known Allergies  Social History:  Social History   Socioeconomic History   Marital status: Divorced    Spouse name: Not on file   Number of children: Not on file   Years of education: Not on file   Highest education level: Not on file  Occupational History   Not on file  Tobacco Use   Smoking status: Former    Packs/day: 0.25    Years: 42.00    Pack years: 10.50    Types: Cigarettes    Quit date: 08/05/2015    Years since quitting: 5.4   Smokeless tobacco: Never  Vaping Use   Vaping Use: Never used  Substance and Sexual Activity   Alcohol use: No    Alcohol/week: 0.0 standard drinks   Drug use: No   Sexual activity: Not Currently  Other Topics Concern   Not on file  Social History Narrative   Not on file   Social Determinants of Health   Financial Resource Strain: Not on file  Food Insecurity: Not on file   Transportation Needs: Not on file  Physical Activity: Not on file  Stress: Not on file  Social Connections: Not on file  Intimate Partner Violence: Not on file   Social History   Tobacco Use  Smoking Status Former   Packs/day: 0.25  Years: 42.00   Pack years: 10.50   Types: Cigarettes   Quit date: 08/05/2015   Years since quitting: 5.4  Smokeless Tobacco Never   Social History   Substance and Sexual Activity  Alcohol Use No   Alcohol/week: 0.0 standard drinks    Family History:  Family History  Problem Relation Age of Onset   Cancer Mother    Cancer Father    Breast cancer Neg Hx     Past medical history, surgical history, medications, allergies, family history and social history reviewed with patient today and changes made to appropriate areas of the chart.   Review of Systems - negative All other ROS negative except what is listed above and in the HPI.      Objective:    BP 106/68   Pulse (!) 57   Temp 99 F (37.2 C) (Oral)   Ht 5' 1.5" (1.562 m)   Wt 187 lb 6.4 oz (85 kg)   SpO2 96%   BMI 34.84 kg/m   Wt Readings from Last 3 Encounters:  01/12/21 187 lb 6.4 oz (85 kg)  10/30/20 185 lb (83.9 kg)  07/09/20 186 lb 3.2 oz (84.5 kg)    Physical Exam Vitals and nursing note reviewed.  Constitutional:      General: She is awake. She is not in acute distress.    Appearance: She is well-developed. She is not ill-appearing.  HENT:     Head: Normocephalic and atraumatic.     Right Ear: Hearing, tympanic membrane, ear canal and external ear normal. No drainage.     Left Ear: Hearing, tympanic membrane, ear canal and external ear normal. No drainage.     Nose: Nose normal.     Right Sinus: No maxillary sinus tenderness or frontal sinus tenderness.     Left Sinus: No maxillary sinus tenderness or frontal sinus tenderness.     Mouth/Throat:     Mouth: Mucous membranes are moist.     Pharynx: Oropharynx is clear. Uvula midline. No pharyngeal swelling,  oropharyngeal exudate or posterior oropharyngeal erythema.  Eyes:     General: Lids are normal.        Right eye: No discharge.        Left eye: No discharge.     Extraocular Movements: Extraocular movements intact.     Conjunctiva/sclera: Conjunctivae normal.     Pupils: Pupils are equal, round, and reactive to light.     Visual Fields: Right eye visual fields normal and left eye visual fields normal.  Neck:     Thyroid: No thyromegaly.     Vascular: No carotid bruit.     Trachea: Trachea normal.  Cardiovascular:     Rate and Rhythm: Normal rate and regular rhythm.     Heart sounds: Normal heart sounds. No murmur heard.   No gallop.  Pulmonary:     Effort: Pulmonary effort is normal. No accessory muscle usage or respiratory distress.     Breath sounds: Normal breath sounds.  Chest:     Comments: Deferred per patient request, recent mammogram. Abdominal:     General: Bowel sounds are normal.     Palpations: Abdomen is soft. There is no hepatomegaly or splenomegaly.     Tenderness: There is no abdominal tenderness.  Musculoskeletal:        General: Normal range of motion.     Cervical back: Normal range of motion and neck supple.     Right lower leg: No edema.  Left lower leg: No edema.  Lymphadenopathy:     Head:     Right side of head: No submental, submandibular, tonsillar, preauricular or posterior auricular adenopathy.     Left side of head: No submental, submandibular, tonsillar, preauricular or posterior auricular adenopathy.     Cervical: No cervical adenopathy.  Skin:    General: Skin is warm and dry.     Capillary Refill: Capillary refill takes less than 2 seconds.     Findings: No rash.  Neurological:     Mental Status: She is alert and oriented to person, place, and time.     Cranial Nerves: Cranial nerves are intact.     Gait: Gait is intact.     Deep Tendon Reflexes: Reflexes are normal and symmetric.     Reflex Scores:      Brachioradialis reflexes are  2+ on the right side and 2+ on the left side.      Patellar reflexes are 2+ on the right side and 2+ on the left side. Psychiatric:        Attention and Perception: Attention normal.        Mood and Affect: Mood normal.        Speech: Speech normal.        Behavior: Behavior normal. Behavior is cooperative.        Thought Content: Thought content normal.        Judgment: Judgment normal.    Results for orders placed or performed in visit on 12/22/20  HM DIABETES EYE EXAM  Result Value Ref Range   HM Diabetic Eye Exam No Retinopathy No Retinopathy      Assessment & Plan:   Problem List Items Addressed This Visit       Cardiovascular and Mediastinum   Hypertension associated with diabetes ([place]) - Primary   Relevant Orders   Bayer DCA Hb A1c Waived   Comprehensive metabolic panel   CBC with Differential/Platelet     Endocrine   Type 2 diabetes mellitus with obesity (HCC)   Relevant Orders   Bayer DCA Hb A1c Waived   Microalbumin, Urine Waived   Hyperlipidemia associated with type 2 diabetes mellitus ([facility]CC)   Relevant Orders   Bayer DCA Hb A1c Waived   Lipid Panel w/o Chol/HDL Ratio   Postprocedural hypothyroidism   Relevant Orders   T4, free   TSH     Musculoskeletal and Integument   Osteopenia     Other   Vitamin D deficiency   Relevant Orders   VITAMIN D 25 Hydroxy (Vit-D Deficiency, Fractures)     Follow up plan: No follow-ups on file.   LABORATORY TESTING:  - Pap smear: not applicable  IMMUNIZATIONS:   - Tdap: Tetanus vaccination status reviewed: last tetanus booster within 10 years. - Influenza: Up to date - Pneumovax: Up to date - Prevnar: Not applicable - HPV: Not applicable - Zostavax vaccine: Refused  SCREENING: -Mammogram: Up to date  - Colonoscopy: Cologuard done 02/06/20 -- due next 2024 - Bone Density: Up To Date -- 2016 last with osteopenia -Hearing Test: Not applicable  -Spirometry: Not applicable   PATIENT COUNSELING:   Advised to  take 1 mg of folate supplement per day if capable of pregnancy.   Sexuality: Discussed sexually transmitted diseases, partner selection, use of condoms, avoidance of unintended pregnancy  and contraceptive alternatives.   Advised to avoid cigarette smoking.  I discussed with the patient that most people either abstain from alcohol or drink within  safe limits (<=14/week and <=4 drinks/occasion for males, <=7/weeks and <= 3 drinks/occasion for females) and that the risk for alcohol disorders and other health effects rises proportionally with the number of drinks per week and how often a drinker exceeds daily limits.  Discussed cessation/primary prevention of drug use and availability of treatment for abuse.   Diet: Encouraged to adjust caloric intake to maintain  or achieve ideal body weight, to reduce intake of dietary saturated fat and total fat, to limit sodium intake by avoiding high sodium foods and not adding table salt, and to maintain adequate dietary potassium and calcium preferably from fresh fruits, vegetables, and low-fat dairy products.    Stressed the importance of regular exercise  Injury prevention: Discussed safety belts, safety helmets, smoke detector, smoking near bedding or upholstery.   Dental health: Discussed importance of regular tooth brushing, flossing, and dental visits.    NEXT PREVENTATIVE PHYSICAL DUE IN 1 YEAR. No follow-ups on file.

## 2021-01-12 NOTE — Assessment & Plan Note (Signed)
BMI 34.84.  Praised for weight loss. Recommended eating smaller high protein, low fat meals more frequently and exercising 30 mins a day 5 times a week with a goal of 10-15lb weight loss in the next 3 months. Patient voiced their understanding and motivation to adhere to these recommendations.

## 2021-01-12 NOTE — Assessment & Plan Note (Signed)
Chronic, stable.  A1C 5.7% today and urine ALB negative.  Continue current medication regimen and blood sugar checks at home.  Refills sent in.  Return in 6 months.

## 2021-01-12 NOTE — Assessment & Plan Note (Signed)
Chronic, stable with BP at goal in office, well below goal.  Could consider cutting back on Lisinopril at future visits to 2.5 MG if maintains tight control.  Would like to keep on board for kidney protection.  Recommend she monitor BP at least a few mornings a week at home and document.  DASH diet at home.  Labs today: CBC, CMP, TSH.  Return in 6 months.

## 2021-01-12 NOTE — Assessment & Plan Note (Signed)
Noted on past labs with underlying osteopenia, continue supplement and recheck level today.

## 2021-01-12 NOTE — Assessment & Plan Note (Signed)
Chronic, stable.  Continue current medication regimen and adjust as needed.  Lipid panel today. 

## 2021-01-13 LAB — LIPID PANEL W/O CHOL/HDL RATIO
Cholesterol, Total: 139 mg/dL (ref 100–199)
HDL: 36 mg/dL — ABNORMAL LOW (ref >39)
LDL Chol Calc (NIH): 80 mg/dL (ref 0–99)
Triglycerides: 131 mg/dL (ref 0–149)
VLDL Cholesterol Cal: 23 mg/dL (ref 5–40)

## 2021-01-13 LAB — COMPREHENSIVE METABOLIC PANEL
ALT: 12 IU/L (ref 0–32)
AST: 18 IU/L (ref 0–40)
Albumin/Globulin Ratio: 2.2 (ref 1.2–2.2)
Albumin: 4.4 g/dL (ref 3.8–4.8)
Alkaline Phosphatase: 66 IU/L (ref 44–121)
BUN/Creatinine Ratio: 17 (ref 12–28)
BUN: 13 mg/dL (ref 8–27)
Bilirubin Total: 0.4 mg/dL (ref 0.0–1.2)
CO2: 19 mmol/L — ABNORMAL LOW (ref 20–29)
Calcium: 9.8 mg/dL (ref 8.7–10.3)
Chloride: 101 mmol/L (ref 96–106)
Creatinine, Ser: 0.75 mg/dL (ref 0.57–1.00)
Globulin, Total: 2 g/dL (ref 1.5–4.5)
Glucose: 86 mg/dL (ref 65–99)
Potassium: 5 mmol/L (ref 3.5–5.2)
Sodium: 140 mmol/L (ref 134–144)
Total Protein: 6.4 g/dL (ref 6.0–8.5)
eGFR: 89 mL/min/{1.73_m2} (ref >59)

## 2021-01-13 LAB — CBC WITH DIFFERENTIAL/PLATELET
Basophils Absolute: 0.1 10*3/uL (ref 0.0–0.2)
Basos: 2 %
EOS (ABSOLUTE): 0.1 10*3/uL (ref 0.0–0.4)
Eos: 1 %
Hematocrit: 43.2 % (ref 34.0–46.6)
Hemoglobin: 14.2 g/dL (ref 11.1–15.9)
Immature Grans (Abs): 0 10*3/uL (ref 0.0–0.1)
Immature Granulocytes: 0 %
Lymphocytes Absolute: 2.2 10*3/uL (ref 0.7–3.1)
Lymphs: 23 %
MCH: 30 pg (ref 26.6–33.0)
MCHC: 32.9 g/dL (ref 31.5–35.7)
MCV: 91 fL (ref 79–97)
Monocytes Absolute: 1 10*3/uL — ABNORMAL HIGH (ref 0.1–0.9)
Monocytes: 11 %
Neutrophils Absolute: 6.1 10*3/uL (ref 1.4–7.0)
Neutrophils: 63 %
Platelets: 390 10*3/uL (ref 150–450)
RBC: 4.74 x10E6/uL (ref 3.77–5.28)
RDW: 13.8 % (ref 11.7–15.4)
WBC: 9.6 10*3/uL (ref 3.4–10.8)

## 2021-01-13 LAB — VITAMIN D 25 HYDROXY (VIT D DEFICIENCY, FRACTURES): Vit D, 25-Hydroxy: 52.7 ng/mL (ref 30.0–100.0)

## 2021-01-13 LAB — SPECIMEN STATUS REPORT

## 2021-01-13 LAB — TSH: TSH: 2.03 u[IU]/mL (ref 0.450–4.500)

## 2021-01-13 LAB — T4, FREE: Free T4: 1.71 ng/dL (ref 0.82–1.77)

## 2021-01-13 NOTE — Progress Notes (Signed)
Contacted via [place] afternoon Susan Pineda, your labs have returned and overall I have no concerns.  They look fantastic.  Keep up the great work and continue all current medications:) Any questions? Keep being awesome!!  Thank you for allowing me to participate in your care.  I appreciate you. Kindest regards, Samik Balkcom

## 2021-07-15 ENCOUNTER — Encounter: Payer: Self-pay | Admitting: Nurse Practitioner

## 2021-07-15 ENCOUNTER — Other Ambulatory Visit: Payer: Self-pay

## 2021-07-15 ENCOUNTER — Ambulatory Visit (INDEPENDENT_AMBULATORY_CARE_PROVIDER_SITE_OTHER): Payer: Managed Care, Other (non HMO) | Admitting: Nurse Practitioner

## 2021-07-15 VITALS — BP 103/66 | HR 67 | Temp 99.2°F | Ht 61.5 in | Wt 186.2 lb

## 2021-07-15 DIAGNOSIS — E890 Postprocedural hypothyroidism: Secondary | ICD-10-CM

## 2021-07-15 DIAGNOSIS — E785 Hyperlipidemia, unspecified: Secondary | ICD-10-CM

## 2021-07-15 DIAGNOSIS — E6609 Other obesity due to excess calories: Secondary | ICD-10-CM

## 2021-07-15 DIAGNOSIS — I152 Hypertension secondary to endocrine disorders: Secondary | ICD-10-CM

## 2021-07-15 DIAGNOSIS — M85851 Other specified disorders of bone density and structure, right thigh: Secondary | ICD-10-CM | POA: Diagnosis not present

## 2021-07-15 DIAGNOSIS — E1159 Type 2 diabetes mellitus with other circulatory complications: Secondary | ICD-10-CM

## 2021-07-15 DIAGNOSIS — Z6834 Body mass index (BMI) 34.0-34.9, adult: Secondary | ICD-10-CM

## 2021-07-15 DIAGNOSIS — Z23 Encounter for immunization: Secondary | ICD-10-CM

## 2021-07-15 DIAGNOSIS — E559 Vitamin D deficiency, unspecified: Secondary | ICD-10-CM

## 2021-07-15 DIAGNOSIS — E1169 Type 2 diabetes mellitus with other specified complication: Secondary | ICD-10-CM | POA: Diagnosis not present

## 2021-07-15 DIAGNOSIS — E669 Obesity, unspecified: Secondary | ICD-10-CM

## 2021-07-15 LAB — MICROALBUMIN, URINE WAIVED
Creatinine, Urine Waived: 50 mg/dL (ref 10–300)
Microalb, Ur Waived: 30 mg/L — ABNORMAL HIGH (ref 0–19)

## 2021-07-15 LAB — BAYER DCA HB A1C WAIVED: HB A1C (BAYER DCA - WAIVED): 5.9 % — ABNORMAL HIGH (ref 4.8–5.6)

## 2021-07-15 MED ORDER — CONTOUR NEXT TEST VI STRP: ORAL_STRIP | 12 refills | Status: DC

## 2021-07-15 MED ORDER — MICROLET NEXT LANCING DEVICE MISC: 12 refills | Status: DC

## 2021-07-15 NOTE — Assessment & Plan Note (Signed)
Noted on scan 08/27/14.  Repeat DEXA this year with her mammogram, discussed with patient and ordered.  Check Vit D level next visit and continue supplement.

## 2021-07-15 NOTE — Assessment & Plan Note (Signed)
Chronic, stable.  A1C 5.9% today, staying well at goal, and urine ALB ordered.  Continue current medication regimen and blood sugar checks at home.  Refills sent in as needed.  Return in 6 months.  Praised for continued success.

## 2021-07-15 NOTE — Assessment & Plan Note (Signed)
Chronic, stable.  Continue current medication regimen and adjust as needed.  Lipid panel today. 

## 2021-07-15 NOTE — Assessment & Plan Note (Signed)
Chronic, stable with BP at goal in office, well below goal.  Could consider cutting back on Lisinopril at future visits to 2.5 MG if maintains tight control.  Would like to keep on board for kidney protection.  Recommend she monitor BP at least a few mornings a week at home and document.  DASH diet at home.  Labs today: CMP and urine ALB.  Return in 6 months.

## 2021-07-15 NOTE — Progress Notes (Signed)
BP 103/66    Pulse 67    Temp 99.2 F (37.3 C) (Oral)    Ht 5' 1.5" (1.562 m)    Wt 186 lb 3.2 oz (84.5 kg)    SpO2 96%    BMI 34.61 kg/m    Subjective:    Patient ID: Susan Pineda, female    DOB: 11-15-1955, 66 y.o.   MRN: [id number]  HPI: Susan Pineda is a 66 y.o. female  Chief Complaint  Patient presents with   Diabetes   Hyperlipidemia   Hypertension   DIABETES Continues on Metformin 500 MG BID.  Last A1C July was 5.7%. Hypoglycemic episodes:no Polydipsia/polyuria: no Visual disturbance: no Chest pain: no Paresthesias: no Glucose Monitoring: yes             Accucheck frequency: a couple times a week             Fasting glucose: 97 this morning             Post prandial:             Evening:             Before meals: Taking Insulin?: no             Long acting insulin:             Short acting insulin: Blood Pressure Monitoring: not checking Retinal Examination: Up To Date Foot Exam: Up to Date Pneumovax: Had PPSV23 in 2011 Influenza: Up to Date Aspirin: yes    HYPERTENSION / HYPERLIPIDEMIA Continues on Lisinopril 5 MG daily and Pravastatin 40 MG. Satisfied with current treatment? yes Duration of hypertension: chronic BP monitoring frequency: not checking BP range:  BP medication side effects: no Duration of hyperlipidemia: chronic Cholesterol medication side effects: no Cholesterol supplements: none Medication compliance: good compliance Aspirin: yes Recent stressors: no Recurrent headaches: no Visual changes: no Palpitations: no Dyspnea: no Chest pain: no Lower extremity edema: no Dizzy/lightheaded: no    HYPOTHYROIDISM Followed by Dr. Gabriel Carina, continues on Levothyroxine 88 MCG.  Had thyroid removed in 2012.  Sees Dr. Gabriel Carina on 07/29/20, -- sees yearly, but is going to see next visit if can switch over to PCP. Satisfied with current treatment? yes Medication side effects: no Medication compliance: good compliance Etiology of hypothyroidism:   Recent dose adjustment:no Fatigue: no Cold intolerance: no Heat intolerance: no Weight gain: no Weight loss: no Constipation: no Diarrhea/loose stools: no Palpitations: no Lower extremity edema: no Anxiety/depressed mood: no  OSTEOPENIA Last DEXA 08/27/2014.  The BMD measured at Femur Neck Right is 0.753 g/cm2 with a T-score of -2.1.  Adequate calcium & vitamin D: yes Weight bearing exercises: no  Depression screen [facility] 2/9 07/15/2021 07/09/2020 01/07/2020 01/02/2019 09/19/2017  Decreased Interest 0 0 0 0 0  Down, Depressed, Hopeless 0 0 0 0 0  PHQ - 2 Score 0 0 0 0 0  Altered sleeping 0 - - 0 0  Tired, decreased energy 0 - - 0 0  Change in appetite 0 - - 0 0  Feeling bad or failure about yourself  0 - - 0 0  Trouble concentrating 0 - - 0 0  Moving slowly or fidgety/restless 0 - - 0 0  Suicidal thoughts 0 - - 0 0  PHQ-9 Score 0 - - 0 0  Difficult doing work/chores Not difficult at all - - Not difficult at all -     Relevant past medical, surgical, family and social history reviewed  and updated as indicated. Interim medical history since our last visit reviewed. Allergies and medications reviewed and updated.  Review of Systems  Constitutional:  Negative for activity change, appetite change, diaphoresis, fatigue and fever.  Respiratory:  Negative for cough, chest tightness and shortness of breath.   Cardiovascular:  Negative for chest pain, palpitations and leg swelling.  Gastrointestinal: Negative.  Negative for vomiting.  Endocrine: Negative for cold intolerance, heat intolerance, polydipsia, polyphagia and polyuria.  Neurological: Negative.   Psychiatric/Behavioral: Negative.     Per HPI unless specifically indicated above     Objective:    BP 103/66    Pulse 67    Temp 99.2 F (37.3 C) (Oral)    Ht 5' 1.5" (1.562 m)    Wt 186 lb 3.2 oz (84.5 kg)    SpO2 96%    BMI 34.61 kg/m   Wt Readings from Last 3 Encounters:  07/15/21 186 lb 3.2 oz (84.5 kg)  01/12/21 187 lb 6.4  oz (85 kg)  10/30/20 185 lb (83.9 kg)    Physical Exam Vitals and nursing note reviewed.  Constitutional:      General: She is awake. She is not in acute distress.    Appearance: She is well-developed and well-groomed. She is obese. She is not ill-appearing.  HENT:     Head: Normocephalic.     Right Ear: Hearing normal.     Left Ear: Hearing normal.  Eyes:     General: Lids are normal.        Right eye: No discharge.        Left eye: No discharge.     Conjunctiva/sclera: Conjunctivae normal.     Pupils: Pupils are equal, round, and reactive to light.  Neck:     Thyroid: No thyromegaly.     Vascular: No carotid bruit.  Cardiovascular:     Rate and Rhythm: Normal rate and regular rhythm.     Heart sounds: Normal heart sounds. No murmur heard.   No gallop.  Pulmonary:     Effort: Pulmonary effort is normal. No accessory muscle usage or respiratory distress.     Breath sounds: Normal breath sounds.  Abdominal:     General: Bowel sounds are normal.     Palpations: Abdomen is soft.  Musculoskeletal:     Cervical back: Normal range of motion and neck supple.     Right lower leg: No edema.     Left lower leg: No edema.  Lymphadenopathy:     Cervical: No cervical adenopathy.  Skin:    General: Skin is warm and dry.  Neurological:     Mental Status: She is alert and oriented to person, place, and time.  Psychiatric:        Attention and Perception: Attention normal.        Mood and Affect: Mood normal.        Speech: Speech normal.        Behavior: Behavior normal. Behavior is cooperative.        Thought Content: Thought content normal.   Diabetic Foot Exam - Simple   Simple Foot Form Visual Inspection See comments: Yes Sensation Testing Intact to touch and monofilament testing bilaterally: Yes Pulse Check Posterior Tibialis and Dorsalis pulse intact bilaterally: Yes Comments Callus to external aspect right great toe (moderate size) and smaller callus external aspect  left great toe.    Results for orders placed or performed in visit on 01/12/21  Bayer [place] Hb A1c Waived  Result Value Ref Range   HB A1C (BAYER DCA - WAIVED) 5.7 <7.0 %  Microalbumin, Urine Waived  Result Value Ref Range   Microalb, Ur Waived 10 0 - 19 mg/L   Creatinine, Urine Waived 50 10 - 300 mg/dL   Microalb/Creat Ratio 30-300 (H) <30 mg/g  Comprehensive metabolic panel  Result Value Ref Range   Glucose 86 65 - 99 mg/dL   BUN 13 8 - 27 mg/dL   Creatinine, Ser 0.75 0.57 - 1.00 mg/dL   eGFR 89 >59 mL/min/1.73   BUN/Creatinine Ratio 17 12 - 28   Sodium 140 134 - 144 mmol/L   Potassium 5.0 3.5 - 5.2 mmol/L   Chloride 101 96 - 106 mmol/L   CO2 19 (L) 20 - 29 mmol/L   Calcium 9.8 8.7 - 10.3 mg/dL   Total Protein 6.4 6.0 - 8.5 g/dL   Albumin 4.4 3.8 - 4.8 g/dL   Globulin, Total 2.0 1.5 - 4.5 g/dL   Albumin/Globulin Ratio 2.2 1.2 - 2.2   Bilirubin Total 0.4 0.0 - 1.2 mg/dL   Alkaline Phosphatase 66 44 - 121 IU/L   AST 18 0 - 40 IU/L   ALT 12 0 - 32 IU/L  CBC with Differential/Platelet  Result Value Ref Range   WBC 9.6 3.4 - 10.8 x10E3/uL   RBC 4.74 3.77 - 5.28 x10E6/uL   Hemoglobin 14.2 11.1 - 15.9 g/dL   Hematocrit 43.2 34.0 - 46.6 %   MCV 91 79 - 97 fL   MCH 30.0 26.6 - 33.0 pg   MCHC 32.9 31.5 - 35.7 g/dL   RDW 13.8 11.7 - 15.4 %   Platelets 390 150 - 450 x10E3/uL   Neutrophils 63 Not Estab. %   Lymphs 23 Not Estab. %   Monocytes 11 Not Estab. %   Eos 1 Not Estab. %   Basos 2 Not Estab. %   Neutrophils Absolute 6.1 1.4 - 7.0 x10E3/uL   Lymphocytes Absolute 2.2 0.7 - 3.1 x10E3/uL   Monocytes Absolute 1.0 (H) 0.1 - 0.9 x10E3/uL   EOS (ABSOLUTE) 0.1 0.0 - 0.4 x10E3/uL   Basophils Absolute 0.1 0.0 - 0.2 x10E3/uL   Immature Granulocytes 0 Not Estab. %   Immature Grans (Abs) 0.0 0.0 - 0.1 x10E3/uL  T4, free  Result Value Ref Range   Free T4 1.71 0.82 - 1.77 ng/dL  TSH  Result Value Ref Range   TSH 2.030 0.450 - 4.500 uIU/mL  Lipid Panel w/o Chol/HDL Ratio   Result Value Ref Range   Cholesterol, Total 139 100 - 199 mg/dL   Triglycerides 131 0 - 149 mg/dL   HDL 36 (L) >39 mg/dL   VLDL Cholesterol Cal 23 5 - 40 mg/dL   LDL Chol Calc (NIH) 80 0 - 99 mg/dL  VITAMIN D 25 Hydroxy (Vit-D Deficiency, Fractures)  Result Value Ref Range   Vit D, 25-Hydroxy 52.7 30.0 - 100.0 ng/mL  Specimen status report  Result Value Ref Range   specimen status report Comment       Assessment & Plan:   Problem List Items Addressed This Visit       Cardiovascular and Mediastinum   Hypertension associated with diabetes ([place])    Chronic, stable with BP at goal in office, well below goal.  Could consider cutting back on Lisinopril at future visits to 2.5 MG if maintains tight control.  Would like to keep on board for kidney protection.  Recommend she monitor BP at least a few mornings  a week at home and document.  DASH diet at home.  Labs today: CMP and urine ALB.  Return in 6 months.       Relevant Orders   Bayer DCA Hb A1c Waived   Microalbumin, Urine Waived   Comprehensive metabolic panel     Endocrine   Hyperlipidemia associated with type 2 diabetes mellitus (HCC)    Chronic, stable.  Continue current medication regimen and adjust as needed.  Lipid panel today.       Relevant Orders   Bayer DCA Hb A1c Waived   Comprehensive metabolic panel   Lipid Panel w/o Chol/HDL Ratio   Postprocedural hypothyroidism    History of thyroid cancer with postprocedural hypothyroidism.  Continue current medication regimen and collaboration with Dr. Gabriel Carina.  TSH and Free T4 to be performed with endo upcoming.      Type 2 diabetes mellitus with obesity (HCC) - Primary    Chronic, stable.  A1C 5.9% today, staying well at goal, and urine ALB ordered.  Continue current medication regimen and blood sugar checks at home.  Refills sent in as needed.  Return in 6 months.  Praised for continued success.      Relevant Orders   Bayer DCA Hb A1c Waived   Microalbumin, Urine  Waived     Musculoskeletal and Integument   Osteopenia of neck of right femur    Noted on scan 08/27/14.  Repeat DEXA this year with her mammogram, discussed with patient and ordered.  Check Vit D level next visit and continue supplement.        Other   Obesity    BMI 34.61. Recommended eating smaller high protein, low fat meals more frequently and exercising 30 mins a day 5 times a week with a goal of 10-15lb weight loss in the next 3 months. Patient voiced their understanding and motivation to adhere to these recommendations.       Vitamin D deficiency    Noted on past labs with underlying osteopenia, continue supplement and recheck level next visit.      Other Visit Diagnoses     Need for Td vaccine       Td vaccine provided today.  Discussed with patient.   Relevant Orders   Td vaccine greater than or equal to 7yo preservative free IM (Completed)        Follow up plan: Return in about 6 months (around 01/12/2022) for Annual physical.

## 2021-07-15 NOTE — Assessment & Plan Note (Signed)
Noted on past labs with underlying osteopenia, continue supplement and recheck level next visit.

## 2021-07-15 NOTE — Patient Instructions (Signed)

## 2021-07-15 NOTE — Assessment & Plan Note (Addendum)
BMI 34.61. Recommended eating smaller high protein, low fat meals more frequently and exercising 30 mins a day 5 times a week with a goal of 10-15lb weight loss in the next 3 months. Patient voiced their understanding and motivation to adhere to these recommendations.

## 2021-07-15 NOTE — Assessment & Plan Note (Signed)
History of thyroid cancer with postprocedural hypothyroidism.  Continue current medication regimen and collaboration with Dr. Gabriel Carina.  TSH and Free T4 to be performed with endo upcoming.

## 2021-07-16 ENCOUNTER — Other Ambulatory Visit: Payer: Self-pay | Admitting: Nurse Practitioner

## 2021-07-16 DIAGNOSIS — E875 Hyperkalemia: Secondary | ICD-10-CM

## 2021-07-16 LAB — COMPREHENSIVE METABOLIC PANEL
ALT: 13 IU/L (ref 0–32)
AST: 20 IU/L (ref 0–40)
Albumin/Globulin Ratio: 2.1 (ref 1.2–2.2)
Albumin: 4.8 g/dL (ref 3.8–4.8)
Alkaline Phosphatase: 89 IU/L (ref 44–121)
BUN/Creatinine Ratio: 19 (ref 12–28)
BUN: 16 mg/dL (ref 8–27)
Bilirubin Total: 0.4 mg/dL (ref 0.0–1.2)
CO2: 24 mmol/L (ref 20–29)
Calcium: 10 mg/dL (ref 8.7–10.3)
Chloride: 99 mmol/L (ref 96–106)
Creatinine, Ser: 0.83 mg/dL (ref 0.57–1.00)
Globulin, Total: 2.3 g/dL (ref 1.5–4.5)
Glucose: 102 mg/dL — ABNORMAL HIGH (ref 70–99)
Potassium: 5.5 mmol/L — ABNORMAL HIGH (ref 3.5–5.2)
Sodium: 139 mmol/L (ref 134–144)
Total Protein: 7.1 g/dL (ref 6.0–8.5)
eGFR: 79 mL/min/{1.73_m2} (ref >59)

## 2021-07-16 LAB — LIPID PANEL W/O CHOL/HDL RATIO
Cholesterol, Total: 145 mg/dL (ref 100–199)
HDL: 42 mg/dL (ref >39)
LDL Chol Calc (NIH): 86 mg/dL (ref 0–99)
Triglycerides: 88 mg/dL (ref 0–149)
VLDL Cholesterol Cal: 17 mg/dL (ref 5–40)

## 2021-07-16 NOTE — Progress Notes (Signed)
Contacted via [place] -- needs lab only visit for 4 weeks please:)   Good evening Susan Pineda, your labs have returned.  Kidney function remains stable, creatinine and eGFR.  Potassium level is mildly elevated -- you are taking Lisinopril which can elevated this at times.  I recommend cutting back on foods higher in potassium, like mangoes, bananas, potatoes, dried fruit.  Ensure plenty of water intake.  I would like you to return in 4 weeks for lab only visit to recheck this and will have my staff call you to schedule this.  Cholesterol levels remain stable.  No medication changes needed.  Any questions? Keep being awesome!!  Thank you for allowing me to participate in your care.  I appreciate you. Kindest regards, Ivonna Kinnick

## 2021-08-16 ENCOUNTER — Other Ambulatory Visit: Payer: Self-pay

## 2021-08-16 ENCOUNTER — Other Ambulatory Visit: Payer: Managed Care, Other (non HMO)

## 2021-08-16 DIAGNOSIS — E875 Hyperkalemia: Secondary | ICD-10-CM

## 2021-08-17 LAB — POTASSIUM: Potassium: 5 mmol/L (ref 3.5–5.2)

## 2021-08-17 NOTE — Progress Notes (Signed)
Contacted via MyChart   Good morning [place], potassium level now in normal range.  No changes needed.  Have a great day!! Keep being stellar!!  Thank you for allowing me to participate in your care.  I appreciate you. Kindest regards, Noelani Harbach

## 2021-09-01 ENCOUNTER — Other Ambulatory Visit: Payer: Self-pay | Admitting: Physician Assistant

## 2021-09-01 ENCOUNTER — Ambulatory Visit
Admission: RE | Admit: 2021-09-01 | Discharge: 2021-09-01 | Disposition: A | Discharge status: Home / Self Care | Payer: Managed Care, Other (non HMO) | Source: Ambulatory Visit | Attending: Physician Assistant | Admitting: Physician Assistant

## 2021-09-01 ENCOUNTER — Other Ambulatory Visit: Payer: Self-pay

## 2021-09-01 DIAGNOSIS — S42295A Other nondisplaced fracture of upper end of left humerus, initial encounter for closed fracture: Secondary | ICD-10-CM | POA: Insufficient documentation

## 2021-09-04 DIAGNOSIS — Z8781 Personal history of (healed) traumatic fracture: Secondary | ICD-10-CM | POA: Insufficient documentation

## 2021-09-04 DIAGNOSIS — S42202A Unspecified fracture of upper end of left humerus, initial encounter for closed fracture: Secondary | ICD-10-CM | POA: Insufficient documentation

## 2021-12-03 ENCOUNTER — Other Ambulatory Visit: Payer: Self-pay | Admitting: Nurse Practitioner

## 2021-12-03 DIAGNOSIS — Z1231 Encounter for screening mammogram for malignant neoplasm of breast: Secondary | ICD-10-CM

## 2022-01-06 ENCOUNTER — Ambulatory Visit
Admission: RE | Admit: 2022-01-06 | Discharge: 2022-01-06 | Disposition: A | Discharge status: Home / Self Care | Payer: Managed Care, Other (non HMO) | Source: Ambulatory Visit | Attending: Nurse Practitioner | Admitting: Nurse Practitioner

## 2022-01-06 ENCOUNTER — Encounter: Payer: Self-pay | Admitting: Nurse Practitioner

## 2022-01-06 ENCOUNTER — Other Ambulatory Visit: Payer: Self-pay | Admitting: Nurse Practitioner

## 2022-01-06 DIAGNOSIS — M810 Age-related osteoporosis without current pathological fracture: Secondary | ICD-10-CM | POA: Insufficient documentation

## 2022-01-06 DIAGNOSIS — Z1231 Encounter for screening mammogram for malignant neoplasm of breast: Secondary | ICD-10-CM

## 2022-01-06 DIAGNOSIS — M85851 Other specified disorders of bone density and structure, right thigh: Secondary | ICD-10-CM | POA: Diagnosis present

## 2022-01-06 NOTE — Progress Notes (Signed)
Contacted via [place] afternoon [place], your bone density has returned.  It is showing that your past osteopenia, thinning of bone, has advanced into osteoporosis, meaning bones are now brittle and I would recommend we start some medication for this to help prevent or lower your risk for fracture -- which I see you recently had.  We will discuss this at your upcoming visit and treatment options available.  Any questions? Keep being awesome!!  Thank you for allowing me to participate in your care.  I appreciate you. Kindest regards, Maddeline Roorda

## 2022-01-06 NOTE — Progress Notes (Signed)
Contacted via MyChart   Normal mammogram, may repeat in one year:)

## 2022-01-12 ENCOUNTER — Encounter: Payer: Managed Care, Other (non HMO) | Admitting: Nurse Practitioner

## 2022-01-23 NOTE — Patient Instructions (Signed)

## 2022-01-26 ENCOUNTER — Ambulatory Visit (INDEPENDENT_AMBULATORY_CARE_PROVIDER_SITE_OTHER): Payer: Managed Care, Other (non HMO) | Admitting: Nurse Practitioner

## 2022-01-26 ENCOUNTER — Encounter: Payer: Self-pay | Admitting: Nurse Practitioner

## 2022-01-26 VITALS — BP 103/69 | HR 65 | Temp 98.5°F | Ht 61.0 in | Wt 198.0 lb

## 2022-01-26 DIAGNOSIS — E1159 Type 2 diabetes mellitus with other circulatory complications: Secondary | ICD-10-CM | POA: Diagnosis not present

## 2022-01-26 DIAGNOSIS — E1169 Type 2 diabetes mellitus with other specified complication: Secondary | ICD-10-CM

## 2022-01-26 DIAGNOSIS — E559 Vitamin D deficiency, unspecified: Secondary | ICD-10-CM

## 2022-01-26 DIAGNOSIS — Z6834 Body mass index (BMI) 34.0-34.9, adult: Secondary | ICD-10-CM

## 2022-01-26 DIAGNOSIS — M810 Age-related osteoporosis without current pathological fracture: Secondary | ICD-10-CM | POA: Diagnosis not present

## 2022-01-26 DIAGNOSIS — E669 Obesity, unspecified: Secondary | ICD-10-CM

## 2022-01-26 DIAGNOSIS — I152 Hypertension secondary to endocrine disorders: Secondary | ICD-10-CM

## 2022-01-26 DIAGNOSIS — Z0000 Encounter for general adult medical examination without abnormal findings: Secondary | ICD-10-CM

## 2022-01-26 DIAGNOSIS — E6609 Other obesity due to excess calories: Secondary | ICD-10-CM

## 2022-01-26 DIAGNOSIS — E890 Postprocedural hypothyroidism: Secondary | ICD-10-CM

## 2022-01-26 DIAGNOSIS — E785 Hyperlipidemia, unspecified: Secondary | ICD-10-CM

## 2022-01-26 LAB — BAYER DCA HB A1C WAIVED: HB A1C (BAYER DCA - WAIVED): 6.3 % — ABNORMAL HIGH (ref 4.8–5.6)

## 2022-01-26 MED ORDER — ALENDRONATE SODIUM 70 MG PO TABS: 70.0000 mg | ORAL_TABLET | ORAL | 11 refills | Status: DC

## 2022-01-26 MED ORDER — METFORMIN HCL 500 MG PO TABS: 500.0000 mg | ORAL_TABLET | Freq: Two times a day (BID) | ORAL | 4 refills | Status: DC

## 2022-01-26 MED ORDER — PRAVASTATIN SODIUM 40 MG PO TABS: 40.0000 mg | ORAL_TABLET | Freq: Every day | ORAL | 4 refills | Status: DC

## 2022-01-26 MED ORDER — MICROLET NEXT LANCING DEVICE MISC: 12 refills | Status: AC

## 2022-01-26 MED ORDER — LEVOTHYROXINE SODIUM 88 MCG PO TABS
88.0000 ug | ORAL_TABLET | Freq: Every day | ORAL | 4 refills | Status: DC
Start: 2022-01-26 — End: 2022-03-23

## 2022-01-26 MED ORDER — LISINOPRIL 5 MG PO TABS: 5.0000 mg | ORAL_TABLET | Freq: Every day | ORAL | 4 refills | Status: DC

## 2022-01-26 NOTE — Assessment & Plan Note (Signed)
Diagnosed on DEXA 01/06/22.  Educated patient today at length on finding and treatment options, we discussed further Fosamax -- educated her on risks/benefits/side effects.  She would like to trial this.  Fosamax sent to pharmacy and instructed her on how to take this.  Return in 8 weeks to ensure tolerance.

## 2022-01-26 NOTE — Assessment & Plan Note (Signed)
History of thyroid cancer with postprocedural hypothyroidism.  Continue current medication regimen and she will return to Dr. Gabriel Carina as needed (okay for PCP to takeover monitoring).  TSH and Free T4 today on labs.

## 2022-01-26 NOTE — Assessment & Plan Note (Signed)
Chronic, stable.  A1c 6.3% today, staying well at goal although mild elevation from previous of 5.9%, and urine ALB 02 August 2021.  Continue current medication regimen and blood sugar checks at home.  Refills sent in.  Recommend heavy focus on diet changes at home.  Return in 6 months.

## 2022-01-26 NOTE — Assessment & Plan Note (Signed)
Chronic, stable with BP at goal in office today.  Could consider cutting back on Lisinopril at future visits to 2.5 MG if maintains stable control, keep on board though as urine ALB 02 August 2021.  Would like to keep on board for kidney protection.  Recommend she monitor BP at least a few mornings a week at home and document.  DASH diet at home.  Labs today: CMP, CBC, and lipid check.  Return in 6 months.

## 2022-01-26 NOTE — Assessment & Plan Note (Signed)
BMI 37.41, some gain present. Recommended eating smaller high protein, low fat meals more frequently and exercising 30 mins a day 5 times a week with a goal of 10-15lb weight loss in the next 3 months. Patient voiced their understanding and motivation to adhere to these recommendations.

## 2022-01-26 NOTE — Progress Notes (Signed)
BP 103/69   Pulse 65   Temp 98.5 F (36.9 C) (Oral)   Ht '5\' 1"'$  (1.549 m)   Wt 198 lb (89.8 kg)   SpO2 95%   BMI 37.41 kg/m    Subjective:    Patient ID: Susan Pineda, female    DOB: Mar 02, 1956, 66 y.o.   MRN: [id number]  HPI: Susan Pineda is a 66 y.o. female presenting on 01/26/2022 for comprehensive medical examination. Current medical complaints include:none  She currently lives with: self Menopausal Symptoms: no   DIABETES Continues on Metformin 500 MG BID.  Last A1c 5.9%.  Has been eating occasional sweets. Hypoglycemic episodes:no Polydipsia/polyuria: no Visual disturbance: no Chest pain: no Paresthesias: no Glucose Monitoring: yes             Accucheck frequency: occasionally             Fasting glucose: this morning 127             Post prandial:             Evening:             Before meals: Taking Insulin?: no             Long acting insulin:             Short acting insulin: Blood Pressure Monitoring: not checking Retinal Examination: Not Up To Date -- [place] Exam: Up to Date Pneumovax: Up To Date Influenza: Up to Date Aspirin: yes    HYPERTENSION / HYPERLIPIDEMIA Continues on Lisinopril 5 MG daily and Pravastatin 40 MG. Satisfied with current treatment? yes Duration of hypertension: chronic BP monitoring frequency: not checking BP range:  BP medication side effects: no Duration of hyperlipidemia: chronic Cholesterol medication side effects: no Cholesterol supplements: none Medication compliance: good compliance Aspirin: yes Recent stressors: no Recurrent headaches: no Visual changes: no Palpitations: no Dyspnea: no Chest pain: no Lower extremity edema: no Dizzy/lightheaded: no    HYPOTHYROIDISM Had thyroid removed in 2012.  Followed by Dr. Gabriel Carina, continues on Levothyroxine 77 MCG.  Last saw Dr. Gabriel Carina and needs to return annually -- last seen 08/06/21 and PCP can now takeover monitoring. Thyroid control status:stable Satisfied with  current treatment? yes Medication side effects: no Medication compliance: good compliance Etiology of hypothyroidism:  Recent dose adjustment:no Fatigue: no Cold intolerance: no Heat intolerance: no Weight gain: no Weight loss: no Constipation: no Diarrhea/loose stools: no Palpitations: no Lower extremity edema: no Anxiety/depressed mood: no  OSTEOPOROSIS Noted on DEXA 01/06/22 with T-score -2.5.  Had fracture humerus and shoulder in March 2023 after injury at home, is continuing with PT. Past osteoporosis medications/treatments: none Adequate calcium & vitamin D: yes Intolerance to bisphosphonates:no Weight bearing exercises: yes   Depression Screen done today and results listed below:     01/26/2022    9:21 AM 07/15/2021    9:56 AM 07/09/2020    8:41 AM 01/07/2020    8:56 AM 01/02/2019   10:10 AM  Depression screen PHQ 2/9  Decreased Interest 0 0 0 0 0  Down, Depressed, Hopeless 0 0 0 0 0  PHQ - 2 Score 0 0 0 0 0  Altered sleeping 0 0   0  Tired, decreased energy 0 0   0  Change in appetite 0 0   0  Feeling bad or failure about yourself  0 0   0  Trouble concentrating 0 0   0  Moving slowly or fidgety/restless 0  0   0  Suicidal thoughts 0 0   0  PHQ-9 Score 0 0   0  Difficult doing work/chores Not difficult at all Not difficult at all   Not difficult at all       01/07/2020    8:56 AM 07/09/2020    8:40 AM 10/30/2020    3:22 PM 01/26/2022    9:19 AM 01/26/2022    9:48 AM  Fall Risk  Falls in the past year? 0 0  1 1  Was there an injury with Fall? 0   1 1  Fall Risk Category Calculator 0   2 2  Fall Risk Category Low   Moderate Moderate  Patient Fall Risk Level Low fall risk  Low fall risk Moderate fall risk Moderate fall risk  Patient at Risk for Falls Due to    History of fall(s) Orthopedic patient  Fall risk Follow up Falls evaluation completed   Falls evaluation completed Falls evaluation completed    Functional Status Survey: Is the patient deaf or have difficulty  hearing?: No Does the patient have difficulty seeing, even when wearing glasses/contacts?: No Does the patient have difficulty concentrating, remembering, or making decisions?: No Does the patient have difficulty walking or climbing stairs?: No Does the patient have difficulty dressing or bathing?: No Does the patient have difficulty doing errands alone such as visiting a doctor's office or shopping?: No   Past Medical History:  Past Medical History:  Diagnosis Date   Anemia    h/o   Cancer ([place]) 2012   THYROID CA   Chronic kidney disease    KIDNEY STONES   Diabetes mellitus without complication ([place])    Diverticulitis    GERD (gastroesophageal reflux disease)    Hyperlipidemia    Hypertension    Hypothyroidism     Surgical History:  Past Surgical History:  Procedure Laterality Date   HERNIA REPAIR     HERNIA REPAIR  2012, 2013   Dr Rochel Brome   LITHOTRIPSY     X2   PARATHYROIDECTOMY Right    SIGMOIDECTOMY  07/04/2009   colovaginal fistula/diverticulitis   SUBMANDIBULAR GLAND EXCISION Right 12/18/2014   Procedure: Right submandibular gland and subligual gland excision;  Surgeon: Carloyn Manner, MD;  Location: [facility] ORS;  Service: ENT;  Laterality: Right;   THYROIDECTOMY     TOTAL ABDOMINAL HYSTERECTOMY  07/04/1997   VENTRAL HERNIA REPAIR N/A 06/02/2015   Procedure: HERNIA REPAIR VENTRAL ADULT, laparoscopic;  Surgeon: Robert Bellow, MD;  Location: [facility] ORS;  Service: General;  Laterality: N/A;    Medications:  Current Outpatient Medications on File Prior to Visit  Medication Sig   aspirin EC 81 MG tablet Take 81 mg by mouth daily.   Cholecalciferol (D-3-5) 125 MCG (5000 UT) capsule Take 5,000 Units by mouth daily.   Cinnamon 500 MG TABS Take 1 tablet by mouth daily.   Cranberry 1000 MG CAPS Take 1 capsule by mouth daily.   glucose blood (CONTOUR NEXT TEST) test strip Use to check blood sugar 2-3 times a week with goal <130 fasting in the morning and <180 two  hours after eating.  Bring log to visits.   No current facility-administered medications on file prior to visit.    Allergies:  No Known Allergies  Social History:  Social History   Socioeconomic History   Marital status: Divorced    Spouse name: Not on file   Number of children: Not on file   Years of education:  Not on file   Highest education level: Not on file  Occupational History   Not on file  Tobacco Use   Smoking status: Former    Packs/day: 0.25    Years: 42.00    Total pack years: 10.50    Types: Cigarettes    Quit date: 08/05/2015    Years since quitting: 6.4   Smokeless tobacco: Never  Vaping Use   Vaping Use: Never used  Substance and Sexual Activity   Alcohol use: No    Alcohol/week: 0.0 standard drinks of alcohol   Drug use: No   Sexual activity: Not Currently  Other Topics Concern   Not on file  Social History Narrative   Not on file   Social Determinants of Health   Financial Resource Strain: Not on file  Food Insecurity: Not on file  Transportation Needs: Not on file  Physical Activity: Not on file  Stress: Not on file  Social Connections: Not on file  Intimate Partner Violence: Not on file   Social History   Tobacco Use  Smoking Status Former   Packs/day: 0.25   Years: 42.00   Total pack years: 10.50   Types: Cigarettes   Quit date: 08/05/2015   Years since quitting: 6.4  Smokeless Tobacco Never   Social History   Substance and Sexual Activity  Alcohol Use No   Alcohol/week: 0.0 standard drinks of alcohol    Family History:  Family History  Problem Relation Age of Onset   Cancer Mother    Cancer Father    Breast cancer Neg Hx     Past medical history, surgical history, medications, allergies, family history and social history reviewed with patient today and changes made to appropriate areas of the chart.   Review of Systems - negative All other ROS negative except what is listed above and in the HPI.      Objective:     BP 103/69   Pulse 65   Temp 98.5 F (36.9 C) (Oral)   Ht '5\' 1"'$  (1.549 m)   Wt 198 lb (89.8 kg)   SpO2 95%   BMI 37.41 kg/m   Wt Readings from Last 3 Encounters:  01/26/22 198 lb (89.8 kg)  07/15/21 186 lb 3.2 oz (84.5 kg)  01/12/21 187 lb 6.4 oz (85 kg)    Physical Exam Vitals and nursing note reviewed.  Constitutional:      General: She is awake. She is not in acute distress.    Appearance: She is well-developed. She is not ill-appearing.  HENT:     Head: Normocephalic and atraumatic.     Right Ear: Hearing, tympanic membrane, ear canal and external ear normal. No drainage.     Left Ear: Hearing, tympanic membrane, ear canal and external ear normal. No drainage.     Nose: Nose normal.     Right Sinus: No maxillary sinus tenderness or frontal sinus tenderness.     Left Sinus: No maxillary sinus tenderness or frontal sinus tenderness.     Mouth/Throat:     Mouth: Mucous membranes are moist.     Pharynx: Oropharynx is clear. Uvula midline. No pharyngeal swelling, oropharyngeal exudate or posterior oropharyngeal erythema.  Eyes:     General: Lids are normal.        Right eye: No discharge.        Left eye: No discharge.     Extraocular Movements: Extraocular movements intact.     Conjunctiva/sclera: Conjunctivae normal.     Pupils:  Pupils are equal, round, and reactive to light.     Visual Fields: Right eye visual fields normal and left eye visual fields normal.  Neck:     Thyroid: No thyromegaly.     Vascular: No carotid bruit.     Trachea: Trachea normal.  Cardiovascular:     Rate and Rhythm: Normal rate and regular rhythm.     Heart sounds: Normal heart sounds. No murmur heard.    No gallop.  Pulmonary:     Effort: Pulmonary effort is normal. No accessory muscle usage or respiratory distress.     Breath sounds: Normal breath sounds.  Chest:     Comments: Deferred per patient request, recent mammogram. Abdominal:     General: Bowel sounds are normal.      Palpations: Abdomen is soft. There is no hepatomegaly or splenomegaly.     Tenderness: There is no abdominal tenderness.  Musculoskeletal:        General: Normal range of motion.     Cervical back: Normal range of motion and neck supple.     Right lower leg: No edema.     Left lower leg: No edema.  Lymphadenopathy:     Head:     Right side of head: No submental, submandibular, tonsillar, preauricular or posterior auricular adenopathy.     Left side of head: No submental, submandibular, tonsillar, preauricular or posterior auricular adenopathy.     Cervical: No cervical adenopathy.  Skin:    General: Skin is warm and dry.     Capillary Refill: Capillary refill takes less than 2 seconds.     Findings: No rash.  Neurological:     Mental Status: She is alert and oriented to person, place, and time.     Gait: Gait is intact.     Deep Tendon Reflexes: Reflexes are normal and symmetric.     Reflex Scores:      Brachioradialis reflexes are 2+ on the right side and 2+ on the left side.      Patellar reflexes are 2+ on the right side and 2+ on the left side. Psychiatric:        Attention and Perception: Attention normal.        Mood and Affect: Mood normal.        Speech: Speech normal.        Behavior: Behavior normal. Behavior is cooperative.        Thought Content: Thought content normal.        Judgment: Judgment normal.    Results for orders placed or performed in visit on 01/26/22  Bayer DCA Hb A1c Waived  Result Value Ref Range   HB A1C (BAYER DCA - WAIVED) 6.3 (H) 4.8 - 5.6 %      Assessment & Plan:   Problem List Items Addressed This Visit       Cardiovascular and Mediastinum   Hypertension associated with diabetes ([place])    Chronic, stable with BP at goal in office today.  Could consider cutting back on Lisinopril at future visits to 2.5 MG if maintains stable control, keep on board though as urine ALB 02 August 2021.  Would like to keep on board for kidney protection.   Recommend she monitor BP at least a few mornings a week at home and document.  DASH diet at home.  Labs today: CMP, CBC, and lipid check.  Return in 6 months.       Relevant Medications   lisinopril (ZESTRIL) 5 MG  tablet   metFORMIN (GLUCOPHAGE) 500 MG tablet   pravastatin (PRAVACHOL) 40 MG tablet   Other Relevant Orders   Bayer DCA Hb A1c Waived (Completed)   CBC with Differential/Platelet   Comprehensive metabolic panel   TSH     Endocrine   Hyperlipidemia associated with type 2 diabetes mellitus (HCC)    Chronic, stable with A1c remaining <7%.  Continue current medication regimen and adjust as needed.  Lipid panel today.       Relevant Medications   lisinopril (ZESTRIL) 5 MG tablet   metFORMIN (GLUCOPHAGE) 500 MG tablet   pravastatin (PRAVACHOL) 40 MG tablet   Other Relevant Orders   Bayer DCA Hb A1c Waived (Completed)   Comprehensive metabolic panel   Lipid Panel w/o Chol/HDL Ratio   Postprocedural hypothyroidism    History of thyroid cancer with postprocedural hypothyroidism.  Continue current medication regimen and she will return to Dr. Gabriel Carina as needed (okay for PCP to takeover monitoring).  TSH and Free T4 today on labs.      Relevant Medications   levothyroxine (SYNTHROID) 88 MCG tablet   Other Relevant Orders   TSH   T4, free   Type 2 diabetes mellitus with obesity (HCC) - Primary    Chronic, stable.  A1c 6.3% today, staying well at goal although mild elevation from previous of 5.9%, and urine ALB 02 August 2021.  Continue current medication regimen and blood sugar checks at home.  Refills sent in.  Recommend heavy focus on diet changes at home.  Return in 6 months.        Relevant Medications   lisinopril (ZESTRIL) 5 MG tablet   metFORMIN (GLUCOPHAGE) 500 MG tablet   pravastatin (PRAVACHOL) 40 MG tablet   Other Relevant Orders   Bayer DCA Hb A1c Waived (Completed)   Comprehensive metabolic panel     Musculoskeletal and Integument   Osteoporosis of femur  without pathological fracture    Diagnosed on DEXA 01/06/22.  Educated patient today at length on finding and treatment options, we discussed further Fosamax -- educated her on risks/benefits/side effects.  She would like to trial this.  Fosamax sent to pharmacy and instructed her on how to take this.  Return in 8 weeks to ensure tolerance.      Relevant Medications   alendronate (FOSAMAX) 70 MG tablet   Other Relevant Orders   VITAMIN D 25 Hydroxy (Vit-D Deficiency, Fractures)     Other   Obesity    BMI 37.41, some gain present. Recommended eating smaller high protein, low fat meals more frequently and exercising 30 mins a day 5 times a week with a goal of 10-15lb weight loss in the next 3 months. Patient voiced their understanding and motivation to adhere to these recommendations.       Relevant Medications   metFORMIN (GLUCOPHAGE) 500 MG tablet   Vitamin D deficiency    Ongoing with osteoporosis.  Recommend she continue daily supplement and recheck level today.      Relevant Orders   VITAMIN D 25 Hydroxy (Vit-D Deficiency, Fractures)   Other Visit Diagnoses     Encounter for annual physical exam        Annual physical today with labs and health maintenance reviewed, discussed with patient.        Follow up plan: Return in about 8 weeks (around 03/23/2022) for Osteoporosis and 6 months for chronic disease follow-up.   LABORATORY TESTING:  - Pap smear: not applicable  IMMUNIZATIONS:   - Tdap:  Tetanus vaccination status reviewed: last tetanus booster within 10 years. - Influenza: Up to date - Pneumovax: Up to date - Prevnar: Up To Date - HPV: Not applicable - Zostavax vaccine: Refused  SCREENING: -Mammogram: Up to date  - Colonoscopy: Cologuard done 02/06/20 -- due next 2024 - Bone Density: Up To Date -- 2023 Osteoporosis -Hearing Test: Not applicable  -Spirometry: Not applicable   PATIENT COUNSELING:   Advised to take 1 mg of folate supplement per day if capable of  pregnancy.   Sexuality: Discussed sexually transmitted diseases, partner selection, use of condoms, avoidance of unintended pregnancy  and contraceptive alternatives.   Advised to avoid cigarette smoking.  I discussed with the patient that most people either abstain from alcohol or drink within safe limits (<=14/week and <=4 drinks/occasion for males, <=7/weeks and <= 3 drinks/occasion for females) and that the risk for alcohol disorders and other health effects rises proportionally with the number of drinks per week and how often a drinker exceeds daily limits.  Discussed cessation/primary prevention of drug use and availability of treatment for abuse.   Diet: Encouraged to adjust caloric intake to maintain  or achieve ideal body weight, to reduce intake of dietary saturated fat and total fat, to limit sodium intake by avoiding high sodium foods and not adding table salt, and to maintain adequate dietary potassium and calcium preferably from fresh fruits, vegetables, and low-fat dairy products.    Stressed the importance of regular exercise  Injury prevention: Discussed safety belts, safety helmets, smoke detector, smoking near bedding or upholstery.   Dental health: Discussed importance of regular tooth brushing, flossing, and dental visits.    NEXT PREVENTATIVE PHYSICAL DUE IN 1 YEAR. Return in about 8 weeks (around 03/23/2022) for Osteoporosis and 6 months for chronic disease follow-up.

## 2022-01-26 NOTE — Assessment & Plan Note (Signed)
Ongoing with osteoporosis.  Recommend she continue daily supplement and recheck level today. 

## 2022-01-26 NOTE — Assessment & Plan Note (Addendum)
Chronic, stable with A1c remaining <7%.  Continue current medication regimen and adjust as needed.  Lipid panel today.  

## 2022-01-27 LAB — LIPID PANEL W/O CHOL/HDL RATIO
Cholesterol, Total: 120 mg/dL (ref 100–199)
HDL: 36 mg/dL — ABNORMAL LOW (ref >39)
LDL Chol Calc (NIH): 66 mg/dL (ref 0–99)
Triglycerides: 97 mg/dL (ref 0–149)
VLDL Cholesterol Cal: 18 mg/dL (ref 5–40)

## 2022-01-27 LAB — COMPREHENSIVE METABOLIC PANEL
ALT: 12 IU/L (ref 0–32)
AST: 18 IU/L (ref 0–40)
Albumin/Globulin Ratio: 1.9 (ref 1.2–2.2)
Albumin: 4.4 g/dL (ref 3.9–4.9)
Alkaline Phosphatase: 79 IU/L (ref 44–121)
BUN/Creatinine Ratio: 15 (ref 12–28)
BUN: 11 mg/dL (ref 8–27)
Bilirubin Total: 0.3 mg/dL (ref 0.0–1.2)
CO2: 21 mmol/L (ref 20–29)
Calcium: 9.5 mg/dL (ref 8.7–10.3)
Chloride: 100 mmol/L (ref 96–106)
Creatinine, Ser: 0.75 mg/dL (ref 0.57–1.00)
Globulin, Total: 2.3 g/dL (ref 1.5–4.5)
Glucose: 101 mg/dL — ABNORMAL HIGH (ref 70–99)
Potassium: 4.5 mmol/L (ref 3.5–5.2)
Sodium: 140 mmol/L (ref 134–144)
Total Protein: 6.7 g/dL (ref 6.0–8.5)
eGFR: 89 mL/min/{1.73_m2} (ref >59)

## 2022-01-27 LAB — CBC WITH DIFFERENTIAL/PLATELET
Basophils Absolute: 0.1 10*3/uL (ref 0.0–0.2)
Basos: 1 %
EOS (ABSOLUTE): 0.1 10*3/uL (ref 0.0–0.4)
Eos: 1 %
Hematocrit: 47.1 % — ABNORMAL HIGH (ref 34.0–46.6)
Hemoglobin: 15.3 g/dL (ref 11.1–15.9)
Immature Grans (Abs): 0 10*3/uL (ref 0.0–0.1)
Immature Granulocytes: 0 %
Lymphocytes Absolute: 2.6 10*3/uL (ref 0.7–3.1)
Lymphs: 25 %
MCH: 29.6 pg (ref 26.6–33.0)
MCHC: 32.5 g/dL (ref 31.5–35.7)
MCV: 91 fL (ref 79–97)
Monocytes Absolute: 1.3 10*3/uL — ABNORMAL HIGH (ref 0.1–0.9)
Monocytes: 12 %
Neutrophils Absolute: 6.6 10*3/uL (ref 1.4–7.0)
Neutrophils: 61 %
Platelets: 381 10*3/uL (ref 150–450)
RBC: 5.17 x10E6/uL (ref 3.77–5.28)
RDW: 13.1 % (ref 11.7–15.4)
WBC: 10.7 10*3/uL (ref 3.4–10.8)

## 2022-01-27 LAB — VITAMIN D 25 HYDROXY (VIT D DEFICIENCY, FRACTURES): Vit D, 25-Hydroxy: 54.1 ng/mL (ref 30.0–100.0)

## 2022-01-27 LAB — T4, FREE: Free T4: 1.92 ng/dL — ABNORMAL HIGH (ref 0.82–1.77)

## 2022-01-27 LAB — TSH: TSH: 1.34 u[IU]/mL (ref 0.450–4.500)

## 2022-01-27 NOTE — Progress Notes (Signed)
Contacted via [place] afternoon Susan Pineda, your labs have returned and overall these are stable.  Kidney function, creatinine and eGFR, remains normal, as is liver function, AST and ALT.  Thyroid labs show mild elevation in Free T4, but normal TSH -- continue current medication dosing.  Overall great labs, no medication changes needed!!   Keep being wonderful!!  Thank you for allowing me to participate in your care.  I appreciate you. Kindest regards, Raylee Strehl

## 2022-02-14 ENCOUNTER — Other Ambulatory Visit: Payer: Self-pay | Admitting: Nurse Practitioner

## 2022-02-14 NOTE — Telephone Encounter (Signed)
Not on current med list. Requested Prescriptions  Pending Prescriptions Disp Refills  . Microlet Lancets MISC [Pharmacy Med Name: MICROLET COLORED LANCETS 100] 100 each     Sig: USE ONCE DAILY     Endocrinology: Diabetes - Testing Supplies Passed - 02/14/2022  8:49 AM      Passed - Valid encounter within last 12 months    Recent Outpatient Visits          2 weeks ago Type 2 diabetes mellitus with obesity ([place])   [place], Jolene T, NP   7 months ago Type 2 diabetes mellitus with obesity ([place])   [place], [place] T, NP   1 year ago Type 2 diabetes mellitus with obesity ([place])   [place], Jolene T, NP   1 year ago Type 2 diabetes mellitus with obesity ([place])   [place], Henrine Screws T, NP   2 years ago PE (physical exam), annual   [place], Barbaraann Faster, NP      Future Appointments            In 1 month [place], Barbaraann Faster, NP [facility], [place]   In 5 months [place], Barbaraann Faster, NP [facility], [facility]

## 2022-03-20 NOTE — Patient Instructions (Signed)
Eating Plan for Osteoporosis Osteoporosis causes your bones to become weak and brittle. This puts you at greater risk for bone breaks (fractures) from small bumps or falls. Making changes to your diet and increasing your physical activity can help strengthen your bones and improve your overall health. Calcium and vitamin D are nutrients that play an important role in bone health. Vitamin D helps your body use calcium and strengthen bones. It is important to get enough calcium and vitamin D as part of your eating plan for osteoporosis. What are tips for following this plan? Reading food labels Try to get at least 1,000 milligrams (mg) of calcium each day. Look for foods that have at least 50 mg of calcium per serving. Talk with your health care provider about taking a calcium supplement if you do not get enough calcium from food. Do not have more than 2,500 mg of calcium each day. This is the upper limit for food and nutritional supplements combined. Too much calcium may cause constipation and prevent you from absorbing other important nutrients. Choose foods that contain vitamin D. Take a daily vitamin supplement that contains 800-1,000 international units (IU) of vitamin D. The amount may be different depending on your age, body weight, and where you live. Talk with your dietitian or health care provider about how much vitamin D is right for you. Avoid foods that have more than 300 mg of sodium per serving. Too much sodium can cause your body to lose calcium. Talk with your dietitian or health care provider about how much sodium you are allowed each day. Shopping Do not buy foods with added salt, including: Salted snacks. Pickles. Canned soups. Canned meats. Processed meats, such as bacon or precooked or cured meat like sausages or meat loaves. Smoked fish. Meal planning Eat balanced meals that contain protein foods, fruits and vegetables, and foods rich in calcium and vitamin D. Eat at least  5 servings of fruits and vegetables each day. Eat 5-6 oz (142-170 g) of lean meat, poultry, fish, eggs, or beans each day. Lifestyle Do not use any products that contain nicotine or tobacco, such as cigarettes, e-cigarettes, and chewing tobacco. If you need help quitting, ask your health care provider. If your health care provider recommends that you lose weight: Work with a dietitian to develop an eating plan that will help you reach your desired weight goal. Exercise for at least 30 minutes a day, 5 or more days a week, or as told by your health care provider. Work with a physical therapist to develop an exercise plan that includes flexibility, balance, and strength exercises. Do not focus only on aerobic exercise. Do not drink alcohol if: Your health care provider tells you not to drink. You are pregnant, may be pregnant, or are planning to become pregnant. If you drink alcohol: Limit how much you use to: 0-1 drink a day for women. 0-2 drinks a day for men. Be aware of how much alcohol is in your drink. In the [place] one drink equals one 12 oz bottle of beer (355 mL), one 5 oz glass of wine (148 mL), or one 1 oz glass of hard liquor (44 mL). What foods should I eat? Foods high in calcium  Yogurt. Yogurt with fruit. Milk. Evaporated skim milk. Dry milk powder. Calcium-fortified orange juice. Parmesan cheese. Part-skim ricotta cheese. Natural hard cheese. Cream cheese. Cottage cheese. Canned sardines. Canned salmon. Calcium-treated tofu. Calcium-fortified cereal bar. Calcium-fortified cereal. Calcium-fortified graham crackers. Cooked collard greens. Turnip greens. Broccoli.   Kale. Almonds. White beans. Corn tortilla. Foods high in vitamin D Cod liver oil. Fatty fish, such as tuna, mackerel, and salmon. Milk. Fortified soy milk. Fortified fruit juice. Yogurt. Margarine. Egg yolks. Foods high in protein Beef. Lamb. Pork tenderloin. Chicken breast. Tuna (canned). Fish  fillet. Tofu. Cooked soy beans. Soy patty. Beans (canned or cooked). Cottage cheese. Yogurt. Peanut butter. Pumpkin seeds. Nuts. Sunflower seeds. Hard cheese. Milk or other milk products, such as soy milk. The items listed above may not be a complete list of foods and beverages you can eat. Contact a dietitian for more options. Summary Calcium and vitamin D are nutrients that play an important role in bone health and are an important part of your eating plan for osteoporosis. Eat balanced meals that contain protein foods, fruits and vegetables, and foods rich in calcium and vitamin D. Avoid foods that have more than 300 mg of sodium per serving. Too much sodium can cause your body to lose calcium. Exercise is an important part of prevention and treatment of osteoporosis. Aim for at least 30 minutes a day, 5 days a week. This information is not intended to replace advice given to you by your health care provider. Make sure you discuss any questions you have with your health care provider. Document Revised: 12/05/2019 Document Reviewed: 12/05/2019 Elsevier Patient Education  2023 [employer]  

## 2022-03-23 ENCOUNTER — Encounter: Payer: Self-pay | Admitting: Nurse Practitioner

## 2022-03-23 ENCOUNTER — Ambulatory Visit (INDEPENDENT_AMBULATORY_CARE_PROVIDER_SITE_OTHER): Payer: Managed Care, Other (non HMO) | Admitting: Nurse Practitioner

## 2022-03-23 VITALS — BP 101/67 | HR 67 | Temp 98.9°F | Ht 61.0 in | Wt 199.0 lb

## 2022-03-23 DIAGNOSIS — M810 Age-related osteoporosis without current pathological fracture: Secondary | ICD-10-CM | POA: Diagnosis not present

## 2022-03-23 DIAGNOSIS — Z23 Encounter for immunization: Secondary | ICD-10-CM

## 2022-03-23 MED ORDER — DENOSUMAB 60 MG/ML ~~LOC~~ SOSY
60.0000 mg | PREFILLED_SYRINGE | SUBCUTANEOUS | 1 refills | Status: DC
Start: 2022-03-23 — End: 2022-08-01

## 2022-03-23 MED ORDER — MICROLET LANCETS MISC
4 refills | Status: DC
Start: 2022-03-23 — End: 2024-02-12

## 2022-03-23 MED ORDER — LEVOTHYROXINE SODIUM 88 MCG PO TABS
88.0000 ug | ORAL_TABLET | Freq: Every day | ORAL | 4 refills | Status: DC
Start: 2022-03-23 — End: 2023-02-01

## 2022-03-23 NOTE — Progress Notes (Signed)
BP 101/67   Pulse 67   Temp 98.9 F (37.2 C) (Oral)   Ht _0  (1.549 m)   Wt 199 lb (90.3 kg)   SpO2 95%   BMI 37.60 kg/m    Subjective:    Patient ID: Susan Pineda, female    DOB: 25-Dec-1955, 66 y.o.   MRN: [id number]  HPI: Susan Pineda is a 66 y.o. female  Chief Complaint  Patient presents with   Osteoporosis    Patient is here for eight week follow up on Osteoporosis. Patient declines having any concerns or questions at today's visit.    OSTEOPOROSIS Started Fosamax at last visit, she took one pill and this made her "feel like someone had beat me", had pain all over.  Did not take anymore medication, reports this is only change she has had recently and knows it was this pill.  Had closed fracture left humerus in March 2023 -- she is done with therapy and back at work.    DEXA 01/06/22 noted T-score of -2.5. Satisfied with current treatment?: no Medication side effects: yes Medication compliance: good compliance Past osteoporosis medications/treatments: Fosamax Adequate calcium & vitamin D: yes Intolerance to bisphosphonates:yes Weight bearing exercises: yes   Relevant past medical, surgical, family and social history reviewed and updated as indicated. Interim medical history since our last visit reviewed. Allergies and medications reviewed and updated.  Review of Systems  Constitutional:  Negative for activity change, appetite change, diaphoresis, fatigue and fever.  Respiratory:  Negative for cough, chest tightness and shortness of breath.   Cardiovascular:  Negative for chest pain, palpitations and leg swelling.  Gastrointestinal: Negative.  Negative for vomiting.  Endocrine: Negative for cold intolerance, heat intolerance, polydipsia, polyphagia and polyuria.  Neurological: Negative.   Psychiatric/Behavioral: Negative.      Per HPI unless specifically indicated above     Objective:    BP 101/67   Pulse 67   Temp 98.9 F (37.2 C) (Oral)   Ht _1  (1.549  m)   Wt 199 lb (90.3 kg)   SpO2 95%   BMI 37.60 kg/m   Wt Readings from Last 3 Encounters:  03/23/22 199 lb (90.3 kg)  01/26/22 198 lb (89.8 kg)  07/15/21 186 lb 3.2 oz (84.5 kg)    Physical Exam Vitals and nursing note reviewed.  Constitutional:      General: She is awake. She is not in acute distress.    Appearance: She is well-developed and well-groomed. She is obese. She is not ill-appearing.  HENT:     Head: Normocephalic.     Right Ear: Hearing normal.     Left Ear: Hearing normal.  Eyes:     General: Lids are normal.        Right eye: No discharge.        Left eye: No discharge.     Conjunctiva/sclera: Conjunctivae normal.     Pupils: Pupils are equal, round, and reactive to light.  Neck:     Thyroid: No thyromegaly.     Vascular: No carotid bruit.  Cardiovascular:     Rate and Rhythm: Normal rate and regular rhythm.     Heart sounds: Normal heart sounds. No murmur heard.    No gallop.  Pulmonary:     Effort: Pulmonary effort is normal. No accessory muscle usage or respiratory distress.     Breath sounds: Normal breath sounds.  Abdominal:     General: Bowel sounds are normal.  Palpations: Abdomen is soft.  Musculoskeletal:     Cervical back: Normal range of motion and neck supple.     Right lower leg: No edema.     Left lower leg: No edema.  Lymphadenopathy:     Cervical: No cervical adenopathy.  Skin:    General: Skin is warm and dry.  Neurological:     Mental Status: She is alert and oriented to person, place, and time.  Psychiatric:        Attention and Perception: Attention normal.        Mood and Affect: Mood normal.        Speech: Speech normal.        Behavior: Behavior normal. Behavior is cooperative.        Thought Content: Thought content normal.     Results for orders placed or performed in visit on 01/26/22  Bayer DCA Hb A1c Waived  Result Value Ref Range   HB A1C (BAYER DCA - WAIVED) 6.3 (H) 4.8 - 5.6 %  CBC with  Differential/Platelet  Result Value Ref Range   WBC 10.7 3.4 - 10.8 x10E3/uL   RBC 5.17 3.77 - 5.28 x10E6/uL   Hemoglobin 15.3 11.1 - 15.9 g/dL   Hematocrit 47.1 (H) 34.0 - 46.6 %   MCV 91 79 - 97 fL   MCH 29.6 26.6 - 33.0 pg   MCHC 32.5 31.5 - 35.7 g/dL   RDW 13.1 11.7 - 15.4 %   Platelets 381 150 - 450 x10E3/uL   Neutrophils 61 Not Estab. %   Lymphs 25 Not Estab. %   Monocytes 12 Not Estab. %   Eos 1 Not Estab. %   Basos 1 Not Estab. %   Neutrophils Absolute 6.6 1.4 - 7.0 x10E3/uL   Lymphocytes Absolute 2.6 0.7 - 3.1 x10E3/uL   Monocytes Absolute 1.3 (H) 0.1 - 0.9 x10E3/uL   EOS (ABSOLUTE) 0.1 0.0 - 0.4 x10E3/uL   Basophils Absolute 0.1 0.0 - 0.2 x10E3/uL   Immature Granulocytes 0 Not Estab. %   Immature Grans (Abs) 0.0 0.0 - 0.1 x10E3/uL  Comprehensive metabolic panel  Result Value Ref Range   Glucose 101 (H) 70 - 99 mg/dL   BUN 11 8 - 27 mg/dL   Creatinine, Ser 0.75 0.57 - 1.00 mg/dL   eGFR 89 >59 mL/min/1.73   BUN/Creatinine Ratio 15 12 - 28   Sodium 140 134 - 144 mmol/L   Potassium 4.5 3.5 - 5.2 mmol/L   Chloride 100 96 - 106 mmol/L   CO2 21 20 - 29 mmol/L   Calcium 9.5 8.7 - 10.3 mg/dL   Total Protein 6.7 6.0 - 8.5 g/dL   Albumin 4.4 3.9 - 4.9 g/dL   Globulin, Total 2.3 1.5 - 4.5 g/dL   Albumin/Globulin Ratio 1.9 1.2 - 2.2   Bilirubin Total 0.3 0.0 - 1.2 mg/dL   Alkaline Phosphatase 79 44 - 121 IU/L   AST 18 0 - 40 IU/L   ALT 12 0 - 32 IU/L  Lipid Panel w/o Chol/HDL Ratio  Result Value Ref Range   Cholesterol, Total 120 100 - 199 mg/dL   Triglycerides 97 0 - 149 mg/dL   HDL 36 (L) >39 mg/dL   VLDL Cholesterol Cal 18 5 - 40 mg/dL   LDL Chol Calc (NIH) 66 0 - 99 mg/dL  TSH  Result Value Ref Range   TSH 1.340 0.450 - 4.500 uIU/mL  VITAMIN D 25 Hydroxy (Vit-D Deficiency, Fractures)  Result Value Ref Range  Vit D, 25-Hydroxy 54.1 30.0 - 100.0 ng/mL  T4, free  Result Value Ref Range   Free T4 1.92 (H) 0.82 - 1.77 ng/dL      Assessment & Plan:   Problem  List Items Addressed This Visit       Musculoskeletal and Integument   Osteoporosis - Primary    Chronic, ongoing with poor tolerance to Fosamax -- caused severe muscle pain.  Will discontinue this.  She has history of fracture to humerus in March 2023.  Will start Prolia injections every 3 months to help prevent fracture and treat osteoporosis.  Educated her at length on this medication use and side effects to report.  Will receive IM injections here in office via [facility].  Continue daily calcium and Vit D -- history of parathyroid removal.        Relevant Medications   denosumab (PROLIA) 60 MG/ML SOSY injection   Other Visit Diagnoses     Pneumococcal vaccination given       PSV 20 today   Relevant Orders   Pneumococcal conjugate vaccine 20-valent (Prevnar 20)   Flu vaccine need       High dose flu vaccine today.   Relevant Orders   Flu vaccine HIGH DOSE PF (Fluzone High dose)        Follow up plan: Return for as scheduled in January for T2DM.

## 2022-03-23 NOTE — Assessment & Plan Note (Signed)
Chronic, ongoing with poor tolerance to Fosamax -- caused severe muscle pain.  Will discontinue this.  She has history of fracture to humerus in March 2023.  Will start Prolia injections every 3 months to help prevent fracture and treat osteoporosis.  Educated her at length on this medication use and side effects to report.  Will receive IM injections here in office via [facility].  Continue daily calcium and Vit D -- history of parathyroid removal.

## 2022-03-25 ENCOUNTER — Telehealth: Payer: Self-pay | Admitting: Nurse Practitioner

## 2022-03-25 NOTE — Telephone Encounter (Signed)
Curtis from [place], called about patient needs authorization for prolific 60 mg.

## 2022-03-28 NOTE — Telephone Encounter (Signed)
Spoke with patient local pharmacy and informed them that patient was approved via CoverMyMeds. Approval for Prolia 60 MG until 03/28/2024.   KEY: [id number]

## 2022-03-29 NOTE — Telephone Encounter (Signed)
Spoke with patient's local pharmacy to check the status of patient's prescription Prolia. Pharmacy representative says it did go through for the patient and it will cost the patient $150 and the patient will be notified via pharmacy.

## 2022-03-30 ENCOUNTER — Ambulatory Visit (INDEPENDENT_AMBULATORY_CARE_PROVIDER_SITE_OTHER): Payer: Managed Care, Other (non HMO)

## 2022-03-30 DIAGNOSIS — M810 Age-related osteoporosis without current pathological fracture: Secondary | ICD-10-CM | POA: Diagnosis not present

## 2022-03-30 MED ORDER — DENOSUMAB 60 MG/ML ~~LOC~~ SOSY
60.0000 mg | PREFILLED_SYRINGE | Freq: Once | SUBCUTANEOUS | Status: AC
  Administered 2022-03-30: 60 mg via SUBCUTANEOUS

## 2022-07-30 NOTE — Patient Instructions (Signed)
Diabetes Mellitus Basics  Diabetes mellitus, or diabetes, is a long-term (chronic) disease. It occurs when the body does not properly use sugar (glucose) that is released from food after you eat. Diabetes mellitus may be caused by one or both of these problems: Your pancreas does not make enough of a hormone called insulin. Your body does not react in a normal way to the insulin that it makes. Insulin lets glucose enter cells in your body. This gives you energy. If you have diabetes, glucose cannot get into cells. This causes high blood glucose (hyperglycemia). How to treat and manage diabetes You may need to take insulin or other diabetes medicines daily to keep your glucose in balance. If you are prescribed insulin, you will learn how to give yourself insulin by injection. You may need to adjust the amount of insulin you take based on the foods that you eat. You will need to check your blood glucose levels using a glucose monitor as told by your health care provider. The readings can help determine if you have low or high blood glucose. Generally, you should have these blood glucose levels: Before meals (preprandial): 80-130 mg/dL (4.4-7.2 mmol/L). After meals (postprandial): below 180 mg/dL (10 mmol/L). Hemoglobin A1c (HbA1c) level: less than 7%. Your health care provider will set treatment goals for you. Keep all follow-up visits. This is important. Follow these instructions at home: Diabetes medicines Take your diabetes medicines every day as told by your health care provider. List your diabetes medicines here: Name of medicine: ______________________________ Amount (dose): _______________ Time (a.m./p.m.): _______________ Notes: ___________________________________ Name of medicine: ______________________________ Amount (dose): _______________ Time (a.m./p.m.): _______________ Notes: ___________________________________ Name of medicine: ______________________________ Amount (dose):  _______________ Time (a.m./p.m.): _______________ Notes: ___________________________________ Insulin If you use insulin, list the types of insulin you use here: Insulin type: ______________________________ Amount (dose): _______________ Time (a.m./p.m.): _______________Notes: ___________________________________ Insulin type: ______________________________ Amount (dose): _______________ Time (a.m./p.m.): _______________ Notes: ___________________________________ Insulin type: ______________________________ Amount (dose): _______________ Time (a.m./p.m.): _______________ Notes: ___________________________________ Insulin type: ______________________________ Amount (dose): _______________ Time (a.m./p.m.): _______________ Notes: ___________________________________ Insulin type: ______________________________ Amount (dose): _______________ Time (a.m./p.m.): _______________ Notes: ___________________________________ Managing blood glucose  Check your blood glucose levels using a glucose monitor as told by your health care provider. Write down the times that you check your glucose levels here: Time: _______________ Notes: ___________________________________ Time: _______________ Notes: ___________________________________ Time: _______________ Notes: ___________________________________ Time: _______________ Notes: ___________________________________ Time: _______________ Notes: ___________________________________ Time: _______________ Notes: ___________________________________  Low blood glucose Low blood glucose (hypoglycemia) is when glucose is at or below 70 mg/dL (3.9 mmol/L). Symptoms may include: Feeling: Hungry. Sweaty and clammy. Irritable or easily upset. Dizzy. Sleepy. Having: A fast heartbeat. A headache. A change in your vision. Numbness around the mouth, lips, or tongue. Having trouble with: Moving (coordination). Sleeping. Treating low blood glucose To treat low blood  glucose, eat or drink something containing sugar right away. If you can think clearly and swallow safely, follow the 15:15 rule: Take 15 grams of a fast-acting carb (carbohydrate), as told by your health care provider. Some fast-acting carbs are: Glucose tablets: take 3-4 tablets. Hard candy: eat 3-5 pieces. Fruit juice: drink 4 oz (120 mL). Regular (not diet) soda: drink 4-6 oz (120-180 mL). Honey or sugar: eat 1 Tbsp (15 mL). Check your blood glucose levels 15 minutes after you take the carb. If your glucose is still at or below 70 mg/dL (3.9 mmol/L), take 15 grams of a carb again. If your glucose does not go above 70 mg/dL (3.9 mmol/L) after   3 tries, get help right away. After your glucose goes back to normal, eat a meal or a snack within 1 hour. Treating very low blood glucose If your glucose is at or below 54 mg/dL (3 mmol/L), you have very low blood glucose (severe hypoglycemia). This is an emergency. Do not wait to see if the symptoms will go away. Get medical help right away. Call your local emergency services (911 in the [place] Do not drive yourself to the hospital. Questions to ask your health care provider Should I talk with a diabetes educator? What equipment will I need to care for myself at home? What diabetes medicines do I need? When should I take them? How often do I need to check my blood glucose levels? What number can I call if I have questions? When is my follow-up visit? Where can I find a support group for people with diabetes? Where to find more information American Diabetes Association: www.diabetes.org Association of Diabetes Care and Education Specialists: www.diabeteseducator.org Contact a health care provider if: Your blood glucose is at or above 240 mg/dL (13.3 mmol/L) for 2 days in a row. You have been sick or have had a fever for 2 days or more, and you are not getting better. You have any of these problems for more than 6 hours: You cannot eat or  drink. You feel nauseous. You vomit. You have diarrhea. Get help right away if: Your blood glucose is lower than 54 mg/dL (3 mmol/L). You get confused. You have trouble thinking clearly. You have trouble breathing. These symptoms may represent a serious problem that is an emergency. Do not wait to see if the symptoms will go away. Get medical help right away. Call your local emergency services (911 in the [place] Do not drive yourself to the hospital. Summary Diabetes mellitus is a chronic disease that occurs when the body does not properly use sugar (glucose) that is released from food after you eat. Take insulin and diabetes medicines as told. Check your blood glucose every day, as often as told. Keep all follow-up visits. This is important. This information is not intended to replace advice given to you by your health care provider. Make sure you discuss any questions you have with your health care provider. Document Revised: 10/22/2019 Document Reviewed: 10/22/2019 Elsevier Patient Education  2023 [employer]vier Inc.  

## 2022-08-01 ENCOUNTER — Ambulatory Visit: Payer: Managed Care, Other (non HMO) | Admitting: Nurse Practitioner

## 2022-08-01 ENCOUNTER — Encounter: Payer: Self-pay | Admitting: Nurse Practitioner

## 2022-08-01 VITALS — BP 114/79 | HR 62 | Temp 97.9°F | Ht 60.98 in | Wt 196.7 lb

## 2022-08-01 DIAGNOSIS — Z6837 Body mass index (BMI) 37.0-37.9, adult: Secondary | ICD-10-CM

## 2022-08-01 DIAGNOSIS — E6609 Other obesity due to excess calories: Secondary | ICD-10-CM

## 2022-08-01 DIAGNOSIS — E669 Obesity, unspecified: Secondary | ICD-10-CM

## 2022-08-01 DIAGNOSIS — E785 Hyperlipidemia, unspecified: Secondary | ICD-10-CM

## 2022-08-01 DIAGNOSIS — E1169 Type 2 diabetes mellitus with other specified complication: Secondary | ICD-10-CM

## 2022-08-01 DIAGNOSIS — E1159 Type 2 diabetes mellitus with other circulatory complications: Secondary | ICD-10-CM | POA: Diagnosis not present

## 2022-08-01 DIAGNOSIS — E890 Postprocedural hypothyroidism: Secondary | ICD-10-CM | POA: Diagnosis not present

## 2022-08-01 DIAGNOSIS — M810 Age-related osteoporosis without current pathological fracture: Secondary | ICD-10-CM

## 2022-08-01 DIAGNOSIS — E559 Vitamin D deficiency, unspecified: Secondary | ICD-10-CM

## 2022-08-01 DIAGNOSIS — I152 Hypertension secondary to endocrine disorders: Secondary | ICD-10-CM

## 2022-08-01 LAB — MICROALBUMIN, URINE WAIVED
Creatinine, Urine Waived: 50 mg/dL (ref 10–300)
Microalb, Ur Waived: 80 mg/L — ABNORMAL HIGH (ref 0–19)
Microalb/Creat Ratio: >300 mg/g — ABNORMAL HIGH (ref <30)

## 2022-08-01 LAB — BAYER DCA HB A1C WAIVED: HB A1C (BAYER DCA - WAIVED): 6.4 % — ABNORMAL HIGH (ref 4.8–5.6)

## 2022-08-01 MED ORDER — DENOSUMAB 60 MG/ML ~~LOC~~ SOSY: 60.0000 mg | PREFILLED_SYRINGE | SUBCUTANEOUS | 1 refills | Status: AC

## 2022-08-01 NOTE — Assessment & Plan Note (Signed)
Chronic, stable.  BP well at goal in office today.  Could consider cutting back on Lisinopril at future visits to 2.5 MG if maintains stable control, keep on board though as urine ALB 02 August 2021 - recheck today.  Would like to keep on board for kidney protection.  Recommend she monitor BP at least a few mornings a week at home and document.  DASH diet at home.  Labs today: CMP and urine ALB.  Return in 6 months.

## 2022-08-01 NOTE — Assessment & Plan Note (Signed)
BMI 37.19. Recommended eating smaller high protein, low fat meals more frequently and exercising 30 mins a day 5 times a week with a goal of 10-15lb weight loss in the next 3 months. Patient voiced their understanding and motivation to adhere to these recommendations.

## 2022-08-01 NOTE — Assessment & Plan Note (Signed)
Chronic, stable.  A1c 6.4% today, staying well at goal although mild elevation from previous of 6.3%, and urine ALB 02 August 2021 -- recheck today.  Continue current medication regimen and blood sugar checks at home.  Refills sent in.  Recommend heavy focus on diet changes at home.  Return in 6 months.

## 2022-08-01 NOTE — Assessment & Plan Note (Signed)
Ongoing with osteoporosis.  Recommend she continue daily supplement and recheck level today.

## 2022-08-01 NOTE — Assessment & Plan Note (Signed)
Chronic, stable with A1c remaining <7%.  Continue current medication regimen and adjust as needed.  Lipid panel today.

## 2022-08-01 NOTE — Assessment & Plan Note (Addendum)
Chronic, ongoing with poor tolerance to Fosamax -- caused severe muscle pain.  Is tolerating Prolia.  She has history of fracture to humerus in March 2023.  Will continue Prolia injections every 6 months to help prevent fracture and treat osteoporosis.  Educated her at length on this medication use and side effects to report.  Will receive IM injections here in office via [facility].  Continue daily calcium and Vit D -- history of parathyroid removal.

## 2022-08-01 NOTE — Assessment & Plan Note (Signed)
History of thyroid cancer with postprocedural hypothyroidism.  Continue current medication regimen and adjust as needed.  TSH and Free T4 at physical.

## 2022-08-01 NOTE — Progress Notes (Signed)
BP 114/79   Pulse 62   Temp 97.9 F (36.6 C) (Oral)   Ht 5' 0.98" (1.549 m)   Wt 196 lb 11.2 oz (89.2 kg)   SpO2 96%   BMI 37.19 kg/m    Subjective:    Patient ID: Susan Pineda, female    DOB: [birth year], 67 y.o.   MRN: [id number]  HPI: Susan Pineda is a 67 y.o. female  Chief Complaint  Patient presents with   Diabetes   Osteoporosis   Hypertension   Hyperlipidemia   Hypothyroidism   DIABETES Last A1c 6.4% in July 2023.  Continues on Metformin 500 MG BID.  Hypoglycemic episodes:no Polydipsia/polyuria: no Visual disturbance: no Chest pain: no Paresthesias: no Glucose Monitoring: yes             Accucheck frequency: once a week             Fasting glucose: 97 this morning             Post prandial:             Evening:             Before meals: Taking Insulin?: no             Long acting insulin:             Short acting insulin: Blood Pressure Monitoring: not checking Retinal Examination: Not Up To Date - Susan Pineda needs to schedule Foot Exam: Up to Date Pneumovax: Had PPSV23 in 2011 Influenza: Up to Date Aspirin: yes    HYPERTENSION / HYPERLIPIDEMIA Continues on Lisinopril 5 MG daily and Pravastatin 40 MG. Satisfied with current treatment? yes Duration of hypertension: chronic BP monitoring frequency: not checking BP range:  BP medication side effects: no Duration of hyperlipidemia: chronic Cholesterol medication side effects: no Cholesterol supplements: none Medication compliance: good compliance Aspirin: yes Recent stressors: no Recurrent headaches: no Visual changes: no Palpitations: no Dyspnea: no Chest pain: no Lower extremity edema: no Dizzy/lightheaded: no    HYPOTHYROIDISM Continues on Levothyroxine 88 MCG.  Had thyroid removed in 2012.  Sees Dr. Gabriel Carina on 07/29/20, per them PCP can not monitor. Satisfied with current treatment? yes Medication side effects: no Medication compliance: good compliance Etiology of hypothyroidism:  Recent  dose adjustment:no Fatigue: no Cold intolerance: no Heat intolerance: no Weight gain: no Weight loss: no Constipation: no Diarrhea/loose stools: no Palpitations: no Lower extremity edema: no Anxiety/depressed mood: no  OSTEOPOROSIS The BMD measured at Femur Neck Right is 0.697 g/cm2 with a T-score of -2.5. Has been almost year since her last fracture.  Continues on Prolia, due next in March. Satisfied with current treatment?: yes Medication side effects: no Medication compliance: good compliance Past osteoporosis medications/treatments: Fosamax Adequate calcium & vitamin D: yes Intolerance to bisphosphonates:yes Weight bearing exercises: yes      08/01/2022    9:55 AM 03/23/2022    9:16 AM 01/26/2022    9:21 AM 07/15/2021    9:56 AM 07/09/2020    8:41 AM  Depression screen PHQ 2/9  Decreased Interest 0 0 0 0 0  Down, Depressed, Hopeless 0 0 0 0 0  PHQ - 2 Score 0 0 0 0 0  Altered sleeping 0 0 0 0   Tired, decreased energy 0 0 0 0   Change in appetite 0 0 0 0   Feeling bad or failure about yourself  0 0 0 0   Trouble concentrating 0 0 0 0  Moving slowly or fidgety/restless 0 0 0 0   Suicidal thoughts 0 0 0 0   PHQ-9 Score 0 0 0 0   Difficult doing work/chores Not difficult at all Not difficult at all Not difficult at all Not difficult at all        08/01/2022    9:55 AM 03/23/2022    9:17 AM 01/26/2022    9:21 AM  GAD 7 : Generalized Anxiety Score  Nervous, Anxious, on Edge 0 0 0  Control/stop worrying 0 0 0  Worry too much - different things 0 0 0  Trouble relaxing 0 0 0  Restless 0 0 0  Easily annoyed or irritable 0 0 0  Afraid - awful might happen 0 0 0  Total GAD 7 Score 0 0 0  Anxiety Difficulty Not difficult at all Not difficult at all Not difficult at all    Relevant past medical, surgical, family and social history reviewed and updated as indicated. Interim medical history since our last visit reviewed. Allergies and medications reviewed and  updated.  Review of Systems  Constitutional:  Negative for activity change, appetite change, diaphoresis, fatigue and fever.  Respiratory:  Negative for cough, chest tightness and shortness of breath.   Cardiovascular:  Negative for chest pain, palpitations and leg swelling.  Gastrointestinal: Negative.  Negative for vomiting.  Endocrine: Negative for cold intolerance, heat intolerance, polydipsia, polyphagia and polyuria.  Neurological: Negative.   Psychiatric/Behavioral: Negative.      Per HPI unless specifically indicated above     Objective:    BP 114/79   Pulse 62   Temp 97.9 F (36.6 C) (Oral)   Ht 5' 0.98" (1.549 m)   Wt 196 lb 11.2 oz (89.2 kg)   SpO2 96%   BMI 37.19 kg/m   Wt Readings from Last 3 Encounters:  08/01/22 196 lb 11.2 oz (89.2 kg)  03/23/22 199 lb (90.3 kg)  01/26/22 198 lb (89.8 kg)    Physical Exam Vitals and nursing note reviewed.  Constitutional:      General: She is awake. She is not in acute distress.    Appearance: She is well-developed and well-groomed. She is obese. She is not ill-appearing.  HENT:     Head: Normocephalic.     Right Ear: Hearing normal.     Left Ear: Hearing normal.  Eyes:     General: Lids are normal.        Right eye: No discharge.        Left eye: No discharge.     Conjunctiva/sclera: Conjunctivae normal.     Pupils: Pupils are equal, round, and reactive to light.  Neck:     Thyroid: No thyromegaly.     Vascular: No carotid bruit.  Cardiovascular:     Rate and Rhythm: Normal rate and regular rhythm.     Heart sounds: Normal heart sounds. No murmur heard.    No gallop.  Pulmonary:     Effort: Pulmonary effort is normal. No accessory muscle usage or respiratory distress.     Breath sounds: Normal breath sounds.  Abdominal:     General: Bowel sounds are normal.     Palpations: Abdomen is soft.  Musculoskeletal:     Cervical back: Normal range of motion and neck supple.     Right lower leg: No edema.     Left  lower leg: No edema.  Lymphadenopathy:     Cervical: No cervical adenopathy.  Skin:    General: Skin  is warm and dry.  Neurological:     Mental Status: She is alert and oriented to person, place, and time.  Psychiatric:        Attention and Perception: Attention normal.        Mood and Affect: Mood normal.        Speech: Speech normal.        Behavior: Behavior normal. Behavior is cooperative.        Thought Content: Thought content normal.    Diabetic Foot Exam - Simple   Simple Foot Form Visual Inspection No deformities, no ulcerations, no other skin breakdown bilaterally: Yes Sensation Testing Intact to touch and monofilament testing bilaterally: Yes Pulse Check Posterior Tibialis and Dorsalis pulse intact bilaterally: Yes Comments    Results for orders placed or performed in visit on 01/26/22  Bayer DCA Hb A1c Waived  Result Value Ref Range   HB A1C (BAYER DCA - WAIVED) 6.3 (H) 4.8 - 5.6 %  CBC with Differential/Platelet  Result Value Ref Range   WBC 10.7 3.4 - 10.8 x10E3/uL   RBC 5.17 3.77 - 5.28 x10E6/uL   Hemoglobin 15.3 11.1 - 15.9 g/dL   Hematocrit 47.1 (H) 34.0 - 46.6 %   MCV 91 79 - 97 fL   MCH 29.6 26.6 - 33.0 pg   MCHC 32.5 31.5 - 35.7 g/dL   RDW 13.1 11.7 - 15.4 %   Platelets 381 150 - 450 x10E3/uL   Neutrophils 61 Not Estab. %   Lymphs 25 Not Estab. %   Monocytes 12 Not Estab. %   Eos 1 Not Estab. %   Basos 1 Not Estab. %   Neutrophils Absolute 6.6 1.4 - 7.0 x10E3/uL   Lymphocytes Absolute 2.6 0.7 - 3.1 x10E3/uL   Monocytes Absolute 1.3 (H) 0.1 - 0.9 x10E3/uL   EOS (ABSOLUTE) 0.1 0.0 - 0.4 x10E3/uL   Basophils Absolute 0.1 0.0 - 0.2 x10E3/uL   Immature Granulocytes 0 Not Estab. %   Immature Grans (Abs) 0.0 0.0 - 0.1 x10E3/uL  Comprehensive metabolic panel  Result Value Ref Range   Glucose 101 (H) 70 - 99 mg/dL   BUN 11 8 - 27 mg/dL   Creatinine, Ser 0.75 0.57 - 1.00 mg/dL   eGFR 89 >59 mL/min/1.73   BUN/Creatinine Ratio 15 12 - 28   Sodium  140 134 - 144 mmol/L   Potassium 4.5 3.5 - 5.2 mmol/L   Chloride 100 96 - 106 mmol/L   CO2 21 20 - 29 mmol/L   Calcium 9.5 8.7 - 10.3 mg/dL   Total Protein 6.7 6.0 - 8.5 g/dL   Albumin 4.4 3.9 - 4.9 g/dL   Globulin, Total 2.3 1.5 - 4.5 g/dL   Albumin/Globulin Ratio 1.9 1.2 - 2.2   Bilirubin Total 0.3 0.0 - 1.2 mg/dL   Alkaline Phosphatase 79 44 - 121 IU/L   AST 18 0 - 40 IU/L   ALT 12 0 - 32 IU/L  Lipid Panel w/o Chol/HDL Ratio  Result Value Ref Range   Cholesterol, Total 120 100 - 199 mg/dL   Triglycerides 97 0 - 149 mg/dL   HDL 36 (L) >39 mg/dL   VLDL Cholesterol Cal 18 5 - 40 mg/dL   LDL Chol Calc (NIH) 66 0 - 99 mg/dL  TSH  Result Value Ref Range   TSH 1.340 0.450 - 4.500 uIU/mL  VITAMIN D 25 Hydroxy (Vit-D Deficiency, Fractures)  Result Value Ref Range   Vit D, 25-Hydroxy 54.1 30.0 - 100.0 ng/mL  T4, free  Result Value Ref Range   Free T4 1.92 (H) 0.82 - 1.77 ng/dL      Assessment & Plan:   Problem List Items Addressed This Visit       Cardiovascular and Mediastinum   Hypertension associated with diabetes (HCC)    Chronic, stable.  BP well at goal in office today.  Could consider cutting back on Lisinopril at future visits to 2.5 MG if maintains stable control, keep on board though as urine ALB 02 August 2021 - recheck today.  Would like to keep on board for kidney protection.  Recommend she monitor BP at least a few mornings a week at home and document.  DASH diet at home.  Labs today: CMP and urine ALB.  Return in 6 months.       Relevant Orders   Bayer DCA Hb A1c Waived   Microalbumin, Urine Waived   Comprehensive metabolic panel     Endocrine   Hyperlipidemia associated with type 2 diabetes mellitus (HCC)    Chronic, stable with A1c remaining <7%.  Continue current medication regimen and adjust as needed.  Lipid panel today.       Relevant Orders   Bayer DCA Hb A1c Waived   Comprehensive metabolic panel   Lipid Panel w/o Chol/HDL Ratio    Postprocedural hypothyroidism    History of thyroid cancer with postprocedural hypothyroidism.  Continue current medication regimen and adjust as needed.  TSH and Free T4 at physical.      Type 2 diabetes mellitus with obesity (HCC) - Primary    Chronic, stable.  A1c 6.4% today, staying well at goal although mild elevation from previous of 6.3%, and urine ALB 02 August 2021 -- recheck today.  Continue current medication regimen and blood sugar checks at home.  Refills sent in.  Recommend heavy focus on diet changes at home.  Return in 6 months.        Relevant Orders   Bayer DCA Hb A1c Waived   Microalbumin, Urine Waived     Musculoskeletal and Integument   Osteoporosis    Chronic, ongoing with poor tolerance to Fosamax -- caused severe muscle pain.  Is tolerating Prolia.  She has history of fracture to humerus in March 2023.  Will continue Prolia injections every 6 months to help prevent fracture and treat osteoporosis.  Educated her at length on this medication use and side effects to report.  Will receive IM injections here in office via [place].  Continue daily calcium and Vit D -- history of parathyroid removal.        Relevant Medications   denosumab (PROLIA) 60 MG/ML SOSY injection   Other Relevant Orders   VITAMIN D 25 Hydroxy (Vit-D Deficiency, Fractures)     Other   Obesity    BMI 37.19. Recommended eating smaller high protein, low fat meals more frequently and exercising 30 mins a day 5 times a week with a goal of 10-15lb weight loss in the next 3 months. Patient voiced their understanding and motivation to adhere to these recommendations.       Vitamin D deficiency    Ongoing with osteoporosis.  Recommend she continue daily supplement and recheck level today.      Relevant Orders   VITAMIN D 25 Hydroxy (Vit-D Deficiency, Fractures)     Follow up plan: Return in about 6 months (around 01/30/2023) for Annual physical and diabetes check.

## 2022-08-02 LAB — COMPREHENSIVE METABOLIC PANEL
ALT: 14 IU/L (ref 0–32)
AST: 23 IU/L (ref 0–40)
Albumin/Globulin Ratio: 2 (ref 1.2–2.2)
Albumin: 4.6 g/dL (ref 3.9–4.9)
Alkaline Phosphatase: 68 IU/L (ref 44–121)
BUN/Creatinine Ratio: 16 (ref 12–28)
BUN: 12 mg/dL (ref 8–27)
Bilirubin Total: 0.4 mg/dL (ref 0.0–1.2)
CO2: 22 mmol/L (ref 20–29)
Calcium: 9.3 mg/dL (ref 8.7–10.3)
Chloride: 103 mmol/L (ref 96–106)
Creatinine, Ser: 0.73 mg/dL (ref 0.57–1.00)
Globulin, Total: 2.3 g/dL (ref 1.5–4.5)
Glucose: 99 mg/dL (ref 70–99)
Potassium: 4.6 mmol/L (ref 3.5–5.2)
Sodium: 140 mmol/L (ref 134–144)
Total Protein: 6.9 g/dL (ref 6.0–8.5)
eGFR: 91 mL/min/{1.73_m2} (ref >59)

## 2022-08-02 LAB — LIPID PANEL W/O CHOL/HDL RATIO
Cholesterol, Total: 140 mg/dL (ref 100–199)
HDL: 42 mg/dL (ref >39)
LDL Chol Calc (NIH): 82 mg/dL (ref 0–99)
Triglycerides: 82 mg/dL (ref 0–149)
VLDL Cholesterol Cal: 16 mg/dL (ref 5–40)

## 2022-08-02 LAB — VITAMIN D 25 HYDROXY (VIT D DEFICIENCY, FRACTURES): Vit D, 25-Hydroxy: 48.1 ng/mL (ref 30.0–100.0)

## 2022-08-02 NOTE — Progress Notes (Signed)
Contacted via [place] afternoon Marthe, your labs have returned and all looks nice and normal.  Any questions? Keep being amazing!!  Thank you for allowing me to participate in your care.  I appreciate you. Kindest regards, Todrick Siedschlag

## 2022-09-28 ENCOUNTER — Ambulatory Visit (INDEPENDENT_AMBULATORY_CARE_PROVIDER_SITE_OTHER): Payer: Managed Care, Other (non HMO)

## 2022-09-28 DIAGNOSIS — M810 Age-related osteoporosis without current pathological fracture: Secondary | ICD-10-CM | POA: Diagnosis not present

## 2022-09-28 MED ORDER — DENOSUMAB 60 MG/ML ~~LOC~~ SOSY
60.0000 mg | PREFILLED_SYRINGE | Freq: Once | SUBCUTANEOUS | Status: AC
  Administered 2022-09-28: 60 mg via SUBCUTANEOUS

## 2023-01-29 NOTE — Patient Instructions (Signed)
Be Involved in Caring For Your Health:  Taking Medications When medications are taken as directed, they can greatly improve your health. But if they are not taken as prescribed, they may not work. In some cases, not taking them correctly can be harmful. To help ensure your treatment remains effective and safe, understand your medications and how to take them. Bring your medications to each visit for review by your provider.  Your lab results, notes, and after visit summary will be available on My Chart. We strongly encourage you to use this feature. If lab results are abnormal the clinic will contact you with the appropriate steps. If the clinic does not contact you assume the results are satisfactory. You can always view your results on My Chart. If you have questions regarding your health or results, please contact the clinic during office hours. You can also ask questions on My Chart.  We at [facility] are grateful that you chose us to provide your care. We strive to provide evidence-based and compassionate care and are always looking for feedback. If you get a survey from the clinic please complete this so we can hear your opinions.  DASH Eating Plan DASH stands for Dietary Approaches to Stop Hypertension. The DASH eating plan is a healthy eating plan that has been shown to: Lower high blood pressure (hypertension). Reduce your risk for type 2 diabetes, heart disease, and stroke. Help with weight loss. What are tips for following this plan? Reading food labels Check food labels for the amount of salt (sodium) per serving. Choose foods with less than 5 percent of the Daily Value (DV) of sodium. In general, foods with less than 300 milligrams (mg) of sodium per serving fit into this eating plan. To find whole grains, look for the word "whole" as the first word in the ingredient list. Shopping Buy products labeled as "low-sodium" or "no salt added." Buy fresh foods. Avoid canned  foods and pre-made or frozen meals. Cooking Try not to add salt when you cook. Use salt-free seasonings or herbs instead of table salt or sea salt. Check with your health care provider or pharmacist before using salt substitutes. Do not fry foods. Cook foods in healthy ways, such as baking, boiling, grilling, roasting, or broiling. Cook using oils that are good for your heart. These include olive, canola, avocado, soybean, and sunflower oil. Meal planning  Eat a balanced diet. This should include: 4 or more servings of fruits and 4 or more servings of vegetables each day. Try to fill half of your plate with fruits and vegetables. 6-8 servings of whole grains each day. 6 or less servings of lean meat, poultry, or fish each day. 1 oz is 1 serving. A 3 oz (85 g) serving of meat is about the same size as the palm of your hand. One egg is 1 oz (28 g). 2-3 servings of low-fat dairy each day. One serving is 1 cup (237 mL). 1 serving of nuts, seeds, or beans 5 times each week. 2-3 servings of heart-healthy fats. Healthy fats called omega-3 fatty acids are found in foods such as walnuts, flaxseeds, fortified milks, and eggs. These fats are also found in cold-water fish, such as sardines, salmon, and mackerel. Limit how much you eat of: Canned or prepackaged foods. Food that is high in trans fat, such as fried foods. Food that is high in saturated fat, such as fatty meat. Desserts and other sweets, sugary drinks, and other foods with added sugar. Full-fat   dairy products. Do not salt foods before eating. Do not eat more than 4 egg yolks a week. Try to eat at least 2 vegetarian meals a week. Eat more home-cooked food and less restaurant, buffet, and fast food. Lifestyle When eating at a restaurant, ask if your food can be made with less salt or no salt. If you drink alcohol: Limit how much you have to: 0-1 drink a day if you are female. 0-2 drinks a day if you are female. Know how much alcohol is in  your drink. In the [place] one drink is one 12 oz bottle of beer (355 mL), one 5 oz glass of wine (148 mL), or one 1 oz glass of hard liquor (44 mL). General information Avoid eating more than 2,300 mg of salt a day. If you have hypertension, you may need to reduce your sodium intake to 1,500 mg a day. Work with your provider to stay at a healthy body weight or lose weight. Ask what the best weight range is for you. On most days of the week, get at least 30 minutes of exercise that causes your heart to beat faster. This may include walking, swimming, or biking. Work with your provider or dietitian to adjust your eating plan to meet your specific calorie needs. What foods should I eat? Fruits All fresh, dried, or frozen fruit. Canned fruits that are in their natural juice and do not have sugar added to them. Vegetables Fresh or frozen vegetables that are raw, steamed, roasted, or grilled. Low-sodium or reduced-sodium tomato and vegetable juice. Low-sodium or reduced-sodium tomato sauce and tomato paste. Low-sodium or reduced-sodium canned vegetables. Grains Whole-grain or whole-wheat bread. Whole-grain or whole-wheat pasta. Brown rice. Oatmeal. Quinoa. Bulgur. Whole-grain and low-sodium cereals. Pita bread. Low-fat, low-sodium crackers. Whole-wheat flour tortillas. Meats and other proteins Skinless chicken or turkey. Ground chicken or turkey. Pork with fat trimmed off. Fish and seafood. Egg whites. Dried beans, peas, or lentils. Unsalted nuts, nut butters, and seeds. Unsalted canned beans. Lean cuts of beef with fat trimmed off. Low-sodium, lean precooked or cured meat, such as sausages or meat loaves. Dairy Low-fat (1%) or fat-free (skim) milk. Reduced-fat, low-fat, or fat-free cheeses. Nonfat, low-sodium ricotta or cottage cheese. Low-fat or nonfat yogurt. Low-fat, low-sodium cheese. Fats and oils Soft margarine without trans fats. Vegetable oil. Reduced-fat, low-fat, or light mayonnaise and salad  dressings (reduced-sodium). Canola, safflower, olive, avocado, soybean, and sunflower oils. Avocado. Seasonings and condiments Herbs. Spices. Seasoning mixes without salt. Other foods Unsalted popcorn and pretzels. Fat-free sweets. The items listed above may not be all the foods and drinks you can have. Talk to a dietitian to learn more. What foods should I avoid? Fruits Canned fruit in a light or heavy syrup. Fried fruit. Fruit in cream or butter sauce. Vegetables Creamed or fried vegetables. Vegetables in a cheese sauce. Regular canned vegetables that are not marked as low-sodium or reduced-sodium. Regular canned tomato sauce and paste that are not marked as low-sodium or reduced-sodium. Regular tomato and vegetable juices that are not marked as low-sodium or reduced-sodium. Pickles. Olives. Grains Baked goods made with fat, such as croissants, muffins, or some breads. Dry pasta or rice meal packs. Meats and other proteins Fatty cuts of meat. Ribs. Fried meat. Bacon. Bologna, salami, and other precooked or cured meats, such as sausages or meat loaves, that are not lean and low in sodium. Fat from the back of a pig (fatback). Bratwurst. Salted nuts and seeds. Canned beans with added salt. Canned   or smoked fish. Whole eggs or egg yolks. Chicken or turkey with skin. Dairy Whole or 2% milk, cream, and half-and-half. Whole or full-fat cream cheese. Whole-fat or sweetened yogurt. Full-fat cheese. Nondairy creamers. Whipped toppings. Processed cheese and cheese spreads. Fats and oils Butter. Stick margarine. Lard. Shortening. Ghee. Bacon fat. Tropical oils, such as coconut, palm kernel, or palm oil. Seasonings and condiments Onion salt, garlic salt, seasoned salt, table salt, and sea salt. Worcestershire sauce. Tartar sauce. Barbecue sauce. Teriyaki sauce. Soy sauce, including reduced-sodium soy sauce. Steak sauce. Canned and packaged gravies. Fish sauce. Oyster sauce. Cocktail sauce. Store-bought  horseradish. Ketchup. Mustard. Meat flavorings and tenderizers. Bouillon cubes. Hot sauces. Pre-made or packaged marinades. Pre-made or packaged taco seasonings. Relishes. Regular salad dressings. Other foods Salted popcorn and pretzels. The items listed above may not be all the foods and drinks you should avoid. Talk to a dietitian to learn more. Where to find more information National Heart, Lung, and Blood Institute (NHLBI): nhlbi.nih.gov American Heart Association (AHA): heart.org Academy of Nutrition and Dietetics: eatright.org National Kidney Foundation (NKF): kidney.org This information is not intended to replace advice given to you by your health care provider. Make sure you discuss any questions you have with your health care provider. Document Revised: 07/07/2022 Document Reviewed: 07/07/2022 Elsevier Patient Education  2024 [employer].  

## 2023-02-01 ENCOUNTER — Encounter: Payer: Self-pay | Admitting: Nurse Practitioner

## 2023-02-01 ENCOUNTER — Ambulatory Visit (INDEPENDENT_AMBULATORY_CARE_PROVIDER_SITE_OTHER): Payer: Managed Care, Other (non HMO) | Admitting: Nurse Practitioner

## 2023-02-01 VITALS — BP 101/65 | HR 59 | Ht 60.0 in | Wt 191.4 lb

## 2023-02-01 DIAGNOSIS — E669 Obesity, unspecified: Secondary | ICD-10-CM

## 2023-02-01 DIAGNOSIS — E890 Postprocedural hypothyroidism: Secondary | ICD-10-CM

## 2023-02-01 DIAGNOSIS — E1159 Type 2 diabetes mellitus with other circulatory complications: Secondary | ICD-10-CM

## 2023-02-01 DIAGNOSIS — E559 Vitamin D deficiency, unspecified: Secondary | ICD-10-CM

## 2023-02-01 DIAGNOSIS — E66811 Obesity, class 1: Secondary | ICD-10-CM

## 2023-02-01 DIAGNOSIS — E6609 Other obesity due to excess calories: Secondary | ICD-10-CM

## 2023-02-01 DIAGNOSIS — Z114 Encounter for screening for human immunodeficiency virus [HIV]: Secondary | ICD-10-CM

## 2023-02-01 DIAGNOSIS — Z6834 Body mass index (BMI) 34.0-34.9, adult: Secondary | ICD-10-CM

## 2023-02-01 DIAGNOSIS — E785 Hyperlipidemia, unspecified: Secondary | ICD-10-CM

## 2023-02-01 DIAGNOSIS — M810 Age-related osteoporosis without current pathological fracture: Secondary | ICD-10-CM | POA: Diagnosis not present

## 2023-02-01 DIAGNOSIS — E119 Type 2 diabetes mellitus without complications: Secondary | ICD-10-CM

## 2023-02-01 DIAGNOSIS — E1169 Type 2 diabetes mellitus with other specified complication: Secondary | ICD-10-CM

## 2023-02-01 DIAGNOSIS — I152 Hypertension secondary to endocrine disorders: Secondary | ICD-10-CM

## 2023-02-01 DIAGNOSIS — Z0000 Encounter for general adult medical examination without abnormal findings: Secondary | ICD-10-CM | POA: Diagnosis not present

## 2023-02-01 LAB — BAYER DCA HB A1C WAIVED: HB A1C (BAYER DCA - WAIVED): 6.5 % — ABNORMAL HIGH (ref 4.8–5.6)

## 2023-02-01 MED ORDER — METFORMIN HCL 500 MG PO TABS
500.0000 mg | ORAL_TABLET | Freq: Two times a day (BID) | ORAL | 4 refills | Status: DC
Start: 2023-02-01 — End: 2024-02-12

## 2023-02-01 MED ORDER — LISINOPRIL 5 MG PO TABS: 5.0000 mg | ORAL_TABLET | Freq: Every day | ORAL | 4 refills | Status: DC

## 2023-02-01 MED ORDER — PRAVASTATIN SODIUM 40 MG PO TABS
40.0000 mg | ORAL_TABLET | Freq: Every day | ORAL | 4 refills | Status: DC
Start: 2023-02-01 — End: 2024-02-12

## 2023-02-01 MED ORDER — LEVOTHYROXINE SODIUM 88 MCG PO TABS: 88.0000 ug | ORAL_TABLET | Freq: Every day | ORAL | 4 refills | Status: DC

## 2023-02-01 NOTE — Progress Notes (Signed)
BP 101/65   Pulse (!) 59   Ht 5' (1.524 m)   Wt 191 lb 6.4 oz (86.8 kg)   SpO2 97%   BMI 37.38 kg/m    Subjective:    Patient ID: Susan Pineda, female    DOB: 1956-01-09, 67 y.o.   MRN: [id number]  HPI: Susan Pineda is a 67 y.o. female presenting on 02/01/2023 for comprehensive medical examination. Current medical complaints include:none  She currently lives with: self Menopausal Symptoms: no   DIABETES Continues on Metformin 500 MG BID.  Last A1c 6.4%.   Hypoglycemic episodes:no Polydipsia/polyuria: no Visual disturbance: no Chest pain: no Paresthesias: no Glucose Monitoring: yes             Accucheck frequency: occasionally             Fasting glucose: yesterday 107 and day before 99             Post prandial:             Evening:             Before meals: Taking Insulin?: no             Long acting insulin:             Short acting insulin: Blood Pressure Monitoring: not checking Retinal Examination: Not Up To Date -- Clydene Pugh on August 19th Foot Exam: Up to Date Pneumovax: Up To Date Influenza: Up to Date Aspirin: yes    HYPERTENSION / HYPERLIPIDEMIA Continues on Lisinopril 5 MG daily and Pravastatin 40 MG. Satisfied with current treatment? yes Duration of hypertension: chronic BP monitoring frequency: not checking BP range:  BP medication side effects: no Duration of hyperlipidemia: chronic Cholesterol medication side effects: no Cholesterol supplements: none Medication compliance: good compliance Aspirin: yes Recent stressors: no Recurrent headaches: no Visual changes: no Palpitations: no Dyspnea: no Chest pain: no Lower extremity edema: no Dizzy/lightheaded: no    HYPOTHYROIDISM Had thyroid removed in 2012.  Continues Levothyroxine 88 MCG.  In past had visits with endo, Dr. Tedd Sias, last saw 08/06/21 and PCP is to takeover monitoring. Thyroid control status:stable Satisfied with current treatment? yes Medication side effects: no Medication  compliance: good compliance Etiology of hypothyroidism: surgery Recent dose adjustment:no Fatigue: no Cold intolerance: no Heat intolerance: no Weight gain: no Weight loss: no Constipation: no Diarrhea/loose stools: no Palpitations: no Lower extremity edema: no Anxiety/depressed mood: no  OSTEOPOROSIS DEXA 01/06/22 with T-score -2.5.  Had fracture humerus and shoulder in March 2023 after injury at home.  Taking Prolia. Past osteoporosis medications/treatments: none Adequate calcium & vitamin D: yes Intolerance to bisphosphonates:no Weight bearing exercises: yes   Depression Screen done today and results listed below:     02/01/2023    8:39 AM 08/01/2022    9:55 AM 03/23/2022    9:16 AM 01/26/2022    9:21 AM 07/15/2021    9:56 AM  Depression screen PHQ 2/9  Decreased Interest 0 0 0 0 0  Down, Depressed, Hopeless 0 0 0 0 0  PHQ - 2 Score 0 0 0 0 0  Altered sleeping 0 0 0 0 0  Tired, decreased energy 0 0 0 0 0  Change in appetite 0 0 0 0 0  Feeling bad or failure about yourself  0 0 0 0 0  Trouble concentrating 0 0 0 0 0  Moving slowly or fidgety/restless 0 0 0 0 0  Suicidal thoughts 0 0 0 0 0  PHQ-9 Score 0 0 0 0 0  Difficult doing work/chores Not difficult at all Not difficult at all Not difficult at all Not difficult at all Not difficult at all       10/30/2020    3:22 PM 01/26/2022    9:19 AM 01/26/2022    9:48 AM 03/23/2022    9:16 AM 02/01/2023    8:39 AM  Fall Risk  Falls in the past year?  1 1 0 0  Was there an injury with Fall?  1 1 0 0  Fall Risk Category Calculator  2 2 0 0  Fall Risk Category (Retired)  Moderate Moderate Low   (RETIRED) Patient Fall Risk Level Low fall risk Moderate fall risk Moderate fall risk Low fall risk   Patient at Risk for Falls Due to  History of fall(s) Orthopedic patient No Fall Risks No Fall Risks  Fall risk Follow up  Falls evaluation completed Falls evaluation completed Falls evaluation completed Falls evaluation completed        02/01/2023    8:39 AM 08/01/2022    9:55 AM 03/23/2022    9:17 AM 01/26/2022    9:21 AM  GAD 7 : Generalized Anxiety Score  Nervous, Anxious, on Edge 0 0 0 0  Control/stop worrying 0 0 0 0  Worry too much - different things 0 0 0 0  Trouble relaxing 0 0 0 0  Restless 0 0 0 0  Easily annoyed or irritable 0 0 0 0  Afraid - awful might happen 0 0 0 0  Total GAD 7 Score 0 0 0 0  Anxiety Difficulty Not difficult at all Not difficult at all Not difficult at all Not difficult at all   Functional Status Survey: Is the patient deaf or have difficulty hearing?: No Does the patient have difficulty seeing, even when wearing glasses/contacts?: No Does the patient have difficulty concentrating, remembering, or making decisions?: No Does the patient have difficulty walking or climbing stairs?: No Does the patient have difficulty dressing or bathing?: No Does the patient have difficulty doing errands alone such as visiting a doctor's office or shopping?: No   Past Medical History:  Past Medical History:  Diagnosis Date   Anemia    h/o   Cancer (HCC) 2012   THYROID CA   Chronic kidney disease    KIDNEY STONES   Diabetes mellitus without complication (HCC)    Diverticulitis    GERD (gastroesophageal reflux disease)    Hyperlipidemia    Hypertension    Hypothyroidism     Surgical History:  Past Surgical History:  Procedure Laterality Date   HERNIA REPAIR     HERNIA REPAIR  2012, 2013   Dr Renda Rolls   LITHOTRIPSY     X2   PARATHYROIDECTOMY Right    SIGMOIDECTOMY  07/04/2009   colovaginal fistula/diverticulitis   SUBMANDIBULAR GLAND EXCISION Right 12/18/2014   Procedure: Right submandibular gland and subligual gland excision;  Surgeon: Bud Face, MD;  Location: [facility];  Service: ENT;  Laterality: Right;   THYROIDECTOMY     TOTAL ABDOMINAL HYSTERECTOMY  07/04/1997   VENTRAL HERNIA REPAIR N/A 06/02/2015   Procedure: HERNIA REPAIR VENTRAL ADULT, laparoscopic;  Surgeon:  Earline Mayotte, MD;  Location: [facility];  Service: General;  Laterality: N/A;    Medications:  Current Outpatient Medications on File Prior to Visit  Medication Sig   aspirin EC 81 MG tablet Take 81 mg by mouth daily.   Cholecalciferol (D-3-5) 125 MCG (  5000 UT) capsule Take 5,000 Units by mouth daily.   Cinnamon 500 MG TABS Take 1 tablet by mouth daily.   Cranberry 1000 MG CAPS Take 1 capsule by mouth daily.   denosumab (PROLIA) 60 MG/ML SOSY injection Inject 60 mg into the skin every 6 (six) months.   glucose blood (CONTOUR NEXT TEST) test strip Use to check blood sugar 2-3 times a week with goal <130 fasting in the morning and <180 two hours after eating.  Bring log to visits.   Lancet Devices (MICROLET NEXT LANCING DEVICE) MISC Use to check blood sugar 2-3 times a week with goal <130 fasting in the morning and <180 two hours after eating.  Bring log to visits.   Microlet Lancets MISC Use to check sugars daily.   No current facility-administered medications on file prior to visit.    Allergies:  No Known Allergies  Social History:  Social History   Socioeconomic History   Marital status: Divorced    Spouse name: Not on file   Number of children: Not on file   Years of education: Not on file   Highest education level: Not on file  Occupational History   Not on file  Tobacco Use   Smoking status: Former    Current packs/day: 0.00    Average packs/day: 0.3 packs/day for 42.0 years (10.5 ttl pk-yrs)    Types: Cigarettes    Start date: 08/04/1973    Quit date: 08/05/2015    Years since quitting: 7.4   Smokeless tobacco: Never  Vaping Use   Vaping status: Never Used  Substance and Sexual Activity   Alcohol use: No    Alcohol/week: 0.0 standard drinks of alcohol   Drug use: No   Sexual activity: Not Currently  Other Topics Concern   Not on file  Social History Narrative   Not on file   Social Determinants of Health   Financial Resource Strain: Not on file  Food  Insecurity: Not on file  Transportation Needs: Not on file  Physical Activity: Not on file  Stress: Not on file  Social Connections: Not on file  Intimate Partner Violence: Not on file   Social History   Tobacco Use  Smoking Status Former   Current packs/day: 0.00   Average packs/day: 0.3 packs/day for 42.0 years (10.5 ttl pk-yrs)   Types: Cigarettes   Start date: 08/04/1973   Quit date: 08/05/2015   Years since quitting: 7.4  Smokeless Tobacco Never   Social History   Substance and Sexual Activity  Alcohol Use No   Alcohol/week: 0.0 standard drinks of alcohol    Family History:  Family History  Problem Relation Age of Onset   Cancer Mother    Cancer Father    Breast cancer Neg Hx     Past medical history, surgical history, medications, allergies, family history and social history reviewed with patient today and changes made to appropriate areas of the chart.   Review of Systems - negative All other ROS negative except what is listed above and in the HPI.      Objective:    BP 101/65   Pulse (!) 59   Ht 5' (1.524 m)   Wt 191 lb 6.4 oz (86.8 kg)   SpO2 97%   BMI 37.38 kg/m   Wt Readings from Last 3 Encounters:  02/01/23 191 lb 6.4 oz (86.8 kg)  08/01/22 196 lb 11.2 oz (89.2 kg)  03/23/22 199 lb (90.3 kg)    Physical Exam  Vitals and nursing note reviewed.  Constitutional:      General: She is awake. She is not in acute distress.    Appearance: She is well-developed. She is not ill-appearing.  HENT:     Head: Normocephalic and atraumatic.     Right Ear: Hearing, tympanic membrane, ear canal and external ear normal. No drainage.     Left Ear: Hearing, tympanic membrane, ear canal and external ear normal. No drainage.     Nose: Nose normal.     Right Sinus: No maxillary sinus tenderness or frontal sinus tenderness.     Left Sinus: No maxillary sinus tenderness or frontal sinus tenderness.     Mouth/Throat:     Mouth: Mucous membranes are moist.     Pharynx:  Oropharynx is clear. Uvula midline. No pharyngeal swelling, oropharyngeal exudate or posterior oropharyngeal erythema.  Eyes:     General: Lids are normal.        Right eye: No discharge.        Left eye: No discharge.     Extraocular Movements: Extraocular movements intact.     Conjunctiva/sclera: Conjunctivae normal.     Pupils: Pupils are equal, round, and reactive to light.     Visual Fields: Right eye visual fields normal and left eye visual fields normal.  Neck:     Thyroid: No thyromegaly.     Vascular: No carotid bruit.     Trachea: Trachea normal.  Cardiovascular:     Rate and Rhythm: Regular rhythm. Bradycardia present.     Heart sounds: Normal heart sounds. No murmur heard.    No gallop.  Pulmonary:     Effort: Pulmonary effort is normal. No accessory muscle usage or respiratory distress.     Breath sounds: Normal breath sounds.  Chest:     Comments: Deferred per patient request. Abdominal:     General: Bowel sounds are normal.     Palpations: Abdomen is soft. There is no hepatomegaly or splenomegaly.     Tenderness: There is no abdominal tenderness.  Musculoskeletal:        General: Normal range of motion.     Cervical back: Normal range of motion and neck supple.     Right lower leg: No edema.     Left lower leg: No edema.  Lymphadenopathy:     Head:     Right side of head: No submental, submandibular, tonsillar, preauricular or posterior auricular adenopathy.     Left side of head: No submental, submandibular, tonsillar, preauricular or posterior auricular adenopathy.     Cervical: No cervical adenopathy.  Skin:    General: Skin is warm and dry.     Capillary Refill: Capillary refill takes less than 2 seconds.     Findings: No rash.  Neurological:     Mental Status: She is alert and oriented to person, place, and time.     Gait: Gait is intact.     Deep Tendon Reflexes: Reflexes are normal and symmetric.     Reflex Scores:      Brachioradialis reflexes are  2+ on the right side and 2+ on the left side.      Patellar reflexes are 2+ on the right side and 2+ on the left side. Psychiatric:        Attention and Perception: Attention normal.        Mood and Affect: Mood normal.        Speech: Speech normal.  Behavior: Behavior normal. Behavior is cooperative.        Thought Content: Thought content normal.        Judgment: Judgment normal.    Results for orders placed or performed in visit on 02/01/23  Bayer DCA Hb A1c Waived  Result Value Ref Range   HB A1C (BAYER DCA - WAIVED) 6.5 (H) 4.8 - 5.6 %      Assessment & Plan:   Problem List Items Addressed This Visit       Cardiovascular and Mediastinum   Hypertension associated with diabetes (HCC)    Chronic, stable.  BP well below goal in office today.  Could consider cutting back on Lisinopril at future visits to 2.5 MG if maintains stable control, keep on board though as urine ALB 80 January 2024.  Would like to keep on board for kidney protection.  Recommend she monitor BP at least a few mornings a week at home and document.  DASH diet at home.  Labs today: CMP, CBC, TSH.  Return in 6 months.       Relevant Medications   metFORMIN (GLUCOPHAGE) 500 MG tablet   lisinopril (ZESTRIL) 5 MG tablet   pravastatin (PRAVACHOL) 40 MG tablet   Other Relevant Orders   Bayer DCA Hb A1c Waived (Completed)   CBC with Differential/Platelet   Comprehensive metabolic panel     Endocrine   Hyperlipidemia associated with type 2 diabetes mellitus (HCC)    Chronic, stable with A1c remaining <7%.  Continue current medication regimen and adjust as needed.  Lipid panel today.       Relevant Medications   metFORMIN (GLUCOPHAGE) 500 MG tablet   lisinopril (ZESTRIL) 5 MG tablet   pravastatin (PRAVACHOL) 40 MG tablet   Other Relevant Orders   Bayer DCA Hb A1c Waived (Completed)   Comprehensive metabolic panel   Lipid Panel w/o Chol/HDL Ratio   Postprocedural hypothyroidism    History of thyroid  cancer with postprocedural hypothyroidism.  Continue current medication regimen and adjust as needed.  TSH and Free T4 today.      Relevant Medications   levothyroxine (SYNTHROID) 88 MCG tablet   Other Relevant Orders   TSH   T4, free   Type 2 diabetes mellitus with obesity (HCC) - Primary    Chronic, stable.  A1c 6.5% today, staying well at goal although mild elevation from previous of 6.4%, and urine ALB 80 January 2024 -- maintain Lisinopril.  Continue current medication regimen and blood sugar checks at home.  Refills sent in.  Recommend heavy focus on diet changes at home.  Return in 6 months.   - Foot exam up to date.  Eye exam scheduled. - ACE and statin on board - Vaccinations up to date with exception of Shingrix.      Relevant Medications   metFORMIN (GLUCOPHAGE) 500 MG tablet   lisinopril (ZESTRIL) 5 MG tablet   pravastatin (PRAVACHOL) 40 MG tablet   Other Relevant Orders   Bayer DCA Hb A1c Waived (Completed)     Musculoskeletal and Integument   Osteoporosis    Chronic, ongoing with poor tolerance to Fosamax -- caused severe muscle pain.  Is tolerating Prolia.  She has history of fracture to humerus in March 2023.  Will continue Prolia injections every 6 months to help prevent fracture and treat osteoporosis.  Educated her at length on this medication use and side effects to report.  Will receive IM injections here in office via [facility].  Continue daily calcium and  Vit D -- history of parathyroid removal.  Next DEXA due March 2025.      Relevant Orders   VITAMIN D 25 Hydroxy (Vit-D Deficiency, Fractures)     Other   Obesity    BMI 37.38. Recommended eating smaller high protein, low fat meals more frequently and exercising 30 mins a day 5 times a week with a goal of 10-15lb weight loss in the next 3 months. Patient voiced their understanding and motivation to adhere to these recommendations.       Relevant Medications   metFORMIN (GLUCOPHAGE) 500 MG tablet   Vitamin D  deficiency    Ongoing with osteoporosis.  Recommend she continue daily supplement and recheck level today.      Relevant Orders   VITAMIN D 25 Hydroxy (Vit-D Deficiency, Fractures)   Other Visit Diagnoses     Encounter for screening for HIV       HIV screening on labs today per guidelines, discussed with patient.   Relevant Orders   HIV Antibody (routine testing w rflx)   Encounter for annual physical exam       Annual physical today with labs and health maintenance reviewed, discussed with patient.        Follow up plan: Return in about 6 months (around 08/04/2023) for T2DM, HTN/HLD, OSTEOPOROSIS, THYROID.   LABORATORY TESTING:  - Pap smear: not applicable  IMMUNIZATIONS:   - Tdap: Tetanus vaccination status reviewed: last tetanus booster within 10 years. - Influenza: Up to date - Pneumovax: Up to date - Prevnar: Up To Date - HPV: Not applicable - Zostavax vaccine: Refused  SCREENING: -Mammogram: Up to date - due 01/07/24 - Colonoscopy: Cologuard due next 01/09/2026 - Bone Density: Up To Date -- 2023 Osteoporosis -- due next year March 2025 -Hearing Test: Not applicable  -Spirometry: Not applicable   PATIENT COUNSELING:   Advised to take 1 mg of folate supplement per day if capable of pregnancy.   Sexuality: Discussed sexually transmitted diseases, partner selection, use of condoms, avoidance of unintended pregnancy  and contraceptive alternatives.   Advised to avoid cigarette smoking.  I discussed with the patient that most people either abstain from alcohol or drink within safe limits (<=14/week and <=4 drinks/occasion for males, <=7/weeks and <= 3 drinks/occasion for females) and that the risk for alcohol disorders and other health effects rises proportionally with the number of drinks per week and how often a drinker exceeds daily limits.  Discussed cessation/primary prevention of drug use and availability of treatment for abuse.   Diet: Encouraged to adjust  caloric intake to maintain  or achieve ideal body weight, to reduce intake of dietary saturated fat and total fat, to limit sodium intake by avoiding high sodium foods and not adding table salt, and to maintain adequate dietary potassium and calcium preferably from fresh fruits, vegetables, and low-fat dairy products.    Stressed the importance of regular exercise  Injury prevention: Discussed safety belts, safety helmets, smoke detector, smoking near bedding or upholstery.   Dental health: Discussed importance of regular tooth brushing, flossing, and dental visits.    NEXT PREVENTATIVE PHYSICAL DUE IN 1 YEAR. Return in about 6 months (around 08/04/2023) for T2DM, HTN/HLD, OSTEOPOROSIS, THYROID.

## 2023-02-01 NOTE — Assessment & Plan Note (Signed)
History of thyroid cancer with postprocedural hypothyroidism.  Continue current medication regimen and adjust as needed.  TSH and Free T4 today.

## 2023-02-01 NOTE — Assessment & Plan Note (Signed)
Chronic, stable with A1c remaining <7%.  Continue current medication regimen and adjust as needed.  Lipid panel today.  

## 2023-02-01 NOTE — Assessment & Plan Note (Signed)
BMI 37.38. Recommended eating smaller high protein, low fat meals more frequently and exercising 30 mins a day 5 times a week with a goal of 10-15lb weight loss in the next 3 months. Patient voiced their understanding and motivation to adhere to these recommendations.

## 2023-02-01 NOTE — Assessment & Plan Note (Signed)
Ongoing with osteoporosis.  Recommend she continue daily supplement and recheck level today. 

## 2023-02-01 NOTE — Assessment & Plan Note (Signed)
Chronic, stable.  A1c 6.5% today, staying well at goal although mild elevation from previous of 6.4%, and urine ALB 80 January 2024 -- maintain Lisinopril.  Continue current medication regimen and blood sugar checks at home.  Refills sent in.  Recommend heavy focus on diet changes at home.  Return in 6 months.   - Foot exam up to date.  Eye exam scheduled. - ACE and statin on board - Vaccinations up to date with exception of Shingrix.

## 2023-02-01 NOTE — Assessment & Plan Note (Signed)
Chronic, stable.  BP well below goal in office today.  Could consider cutting back on Lisinopril at future visits to 2.5 MG if maintains stable control, keep on board though as urine ALB 80 January 2024.  Would like to keep on board for kidney protection.  Recommend she monitor BP at least a few mornings a week at home and document.  DASH diet at home.  Labs today: CMP, CBC, TSH.  Return in 6 months.

## 2023-02-01 NOTE — Progress Notes (Signed)
Contacted via MyChart   Good morning, A1c is back and it did trend up just a tad from 6.4% to 6.5% -- for now I would continue twice daily Metformin and if it continues to trend up we may need to adjust further next visit.  Also focus on diet -- cutting back any sweets/pasta/white bread/ white rice/potatoes if presently eating and work on exercise 30 minutes 5 days a week of something you enjoy.

## 2023-02-01 NOTE — Assessment & Plan Note (Signed)
Chronic, ongoing with poor tolerance to Fosamax -- caused severe muscle pain.  Is tolerating Prolia.  She has history of fracture to humerus in March 2023.  Will continue Prolia injections every 6 months to help prevent fracture and treat osteoporosis.  Educated her at length on this medication use and side effects to report.  Will receive IM injections here in office via [facility].  Continue daily calcium and Vit D -- history of parathyroid removal.  Next DEXA due March 2025.

## 2023-02-02 NOTE — Progress Notes (Signed)
Contacted via MyChart   Good morning Susan Pineda, your labs have returned and overall these are reassuring with no medication changes needed .  TSH is normal, Free T4 a little elevated still.  Continue current Levothyroxine dosing at this time.  Any questions? Keep being amazing!!  Thank you for allowing me to participate in your care.  I appreciate you. Kindest regards, Shiven Junious

## 2023-02-20 ENCOUNTER — Encounter: Payer: Self-pay | Admitting: Nurse Practitioner

## 2023-02-20 LAB — HM DIABETES EYE EXAM

## 2023-03-05 IMAGING — MG MM DIGITAL SCREENING BILAT W/ TOMO AND CAD
8 series · 8 of 24 positions shown · non-contrast
Comparison: Previous exam(s).

CLINICAL DATA: Screening.

EXAM:
DIGITAL SCREENING BILATERAL MAMMOGRAM WITH TOMOSYNTHESIS AND CAD
TECHNIQUE: Bilateral screening digital craniocaudal and mediolateral oblique
mammograms were obtained. Bilateral screening digital breast
tomosynthesis was performed. The images were evaluated with
computer-aided detection.

[L MLO synth-2D]
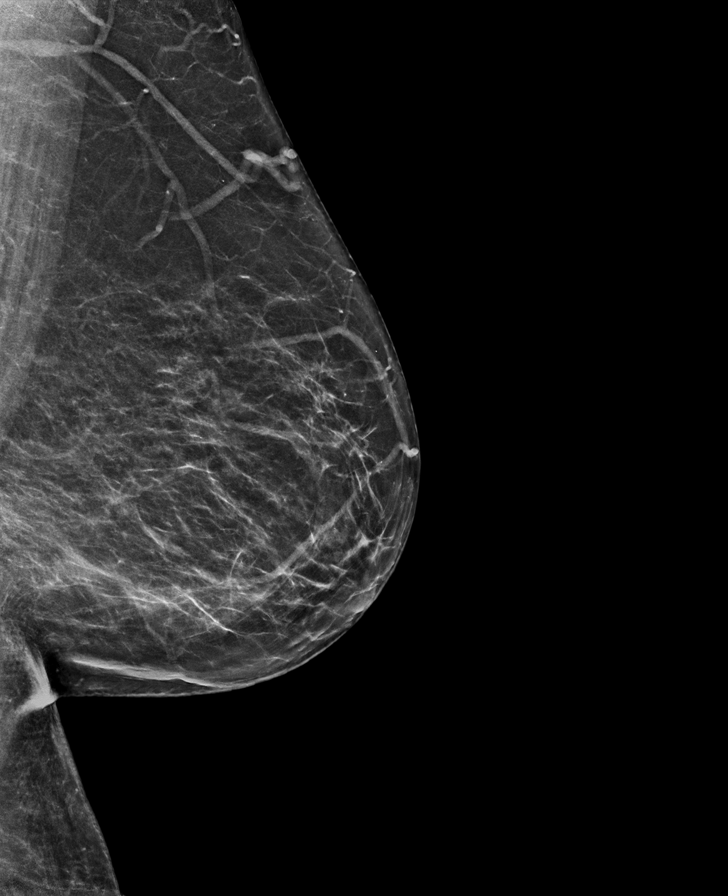

[R CC synth-2D]
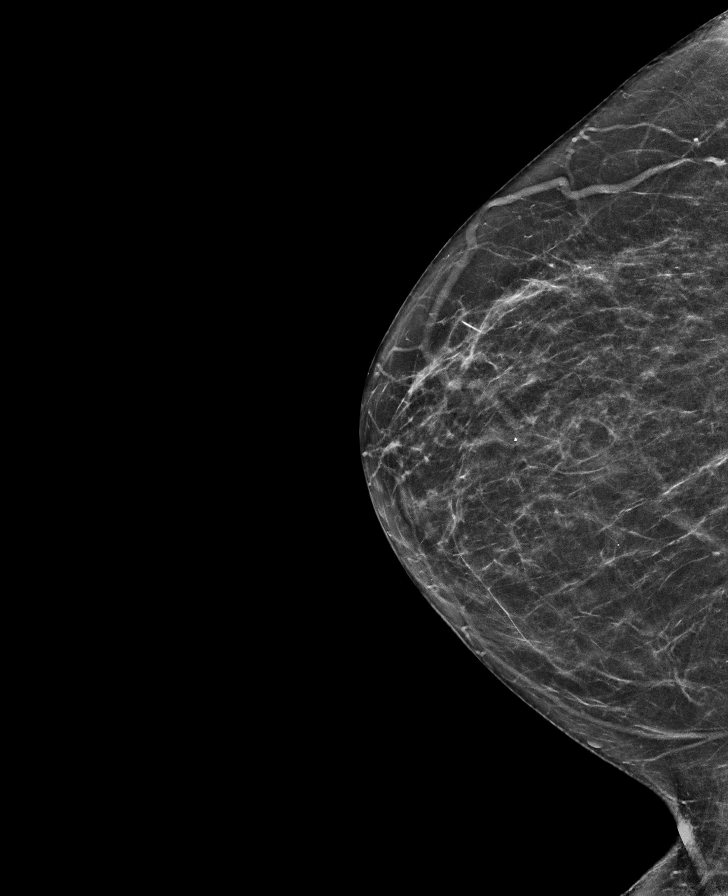

[L CC synth-2D]
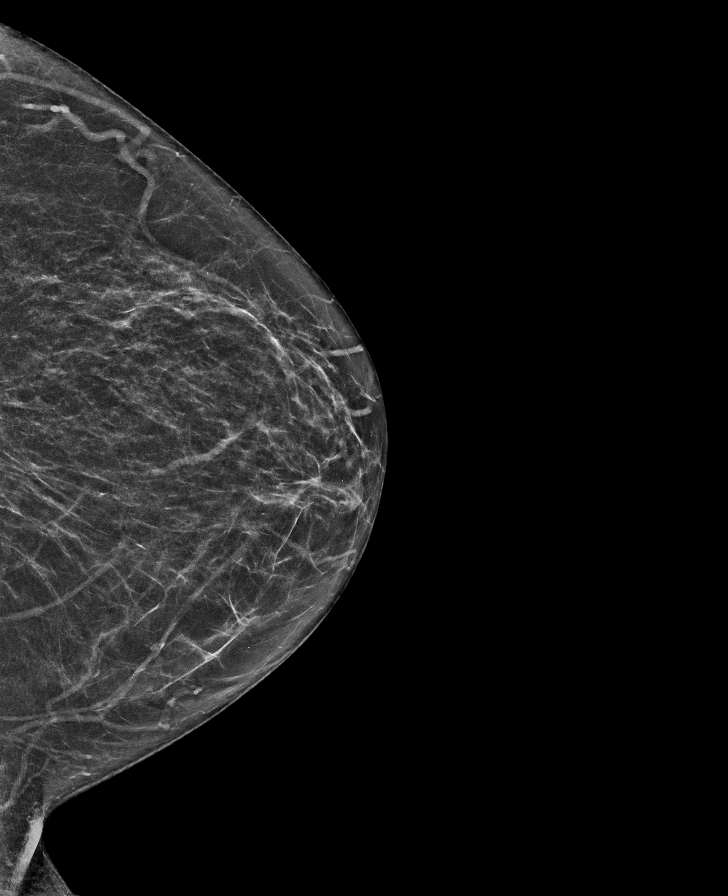

[R MLO synth-2D]
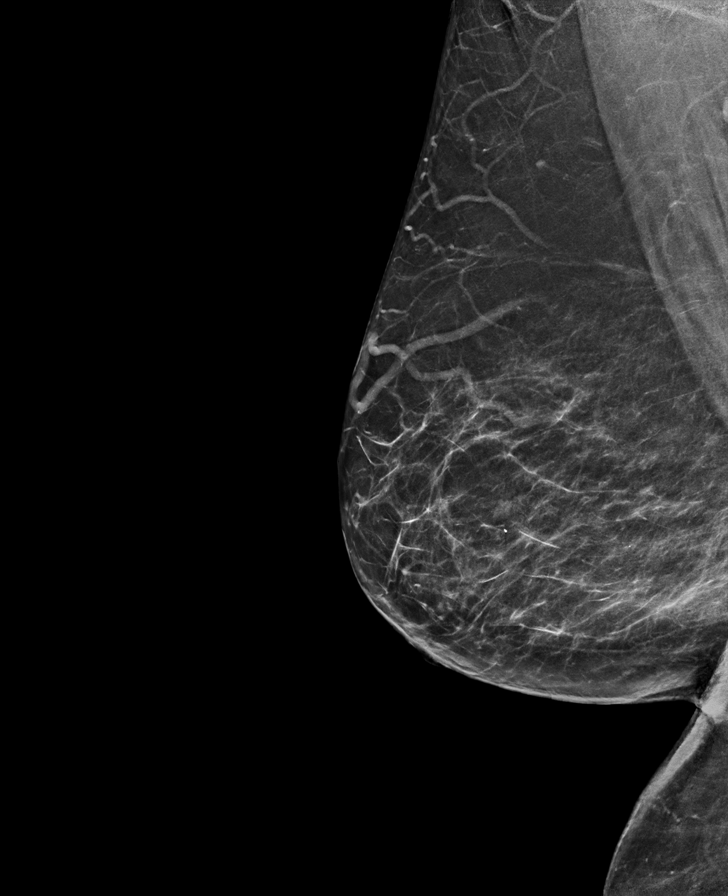

[L MLO tomo · tomo slice 35/68.0]
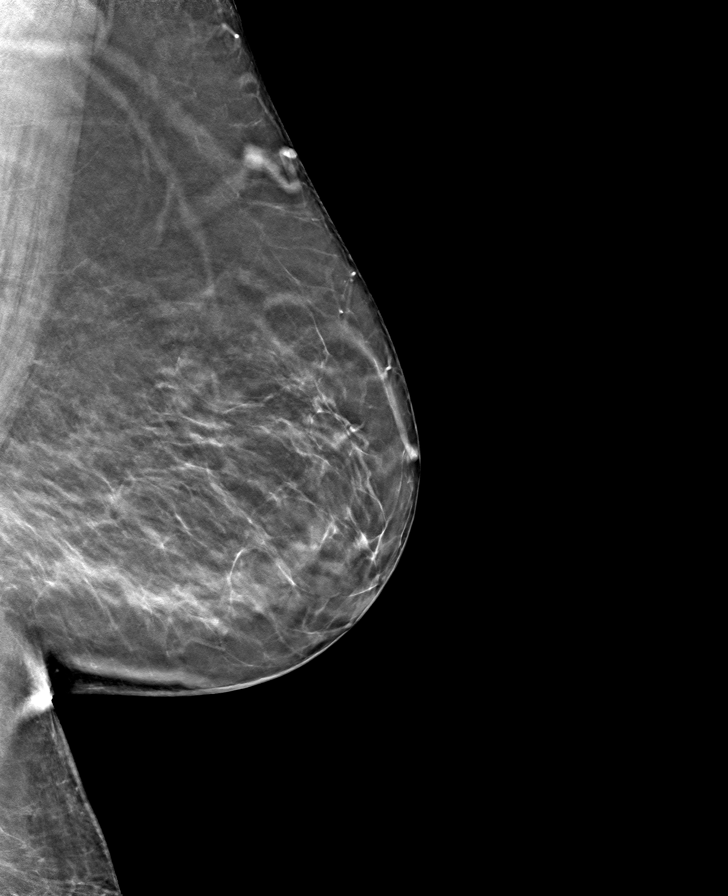

[R CC tomo · tomo slice 31/60.0]
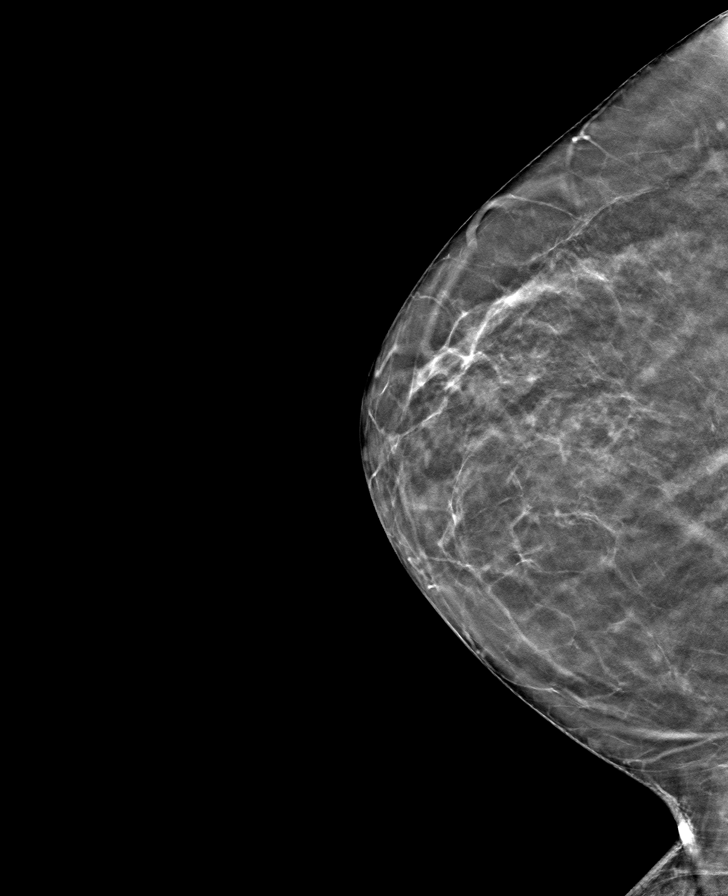

[L CC tomo · tomo slice 31/61.0]
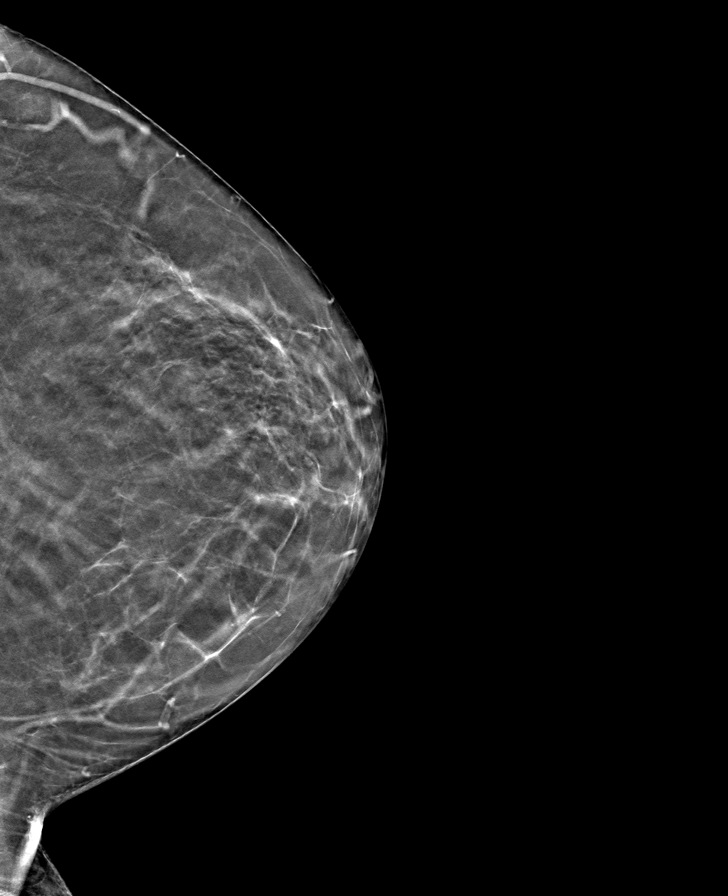

[R MLO tomo · tomo slice 36/71.0]
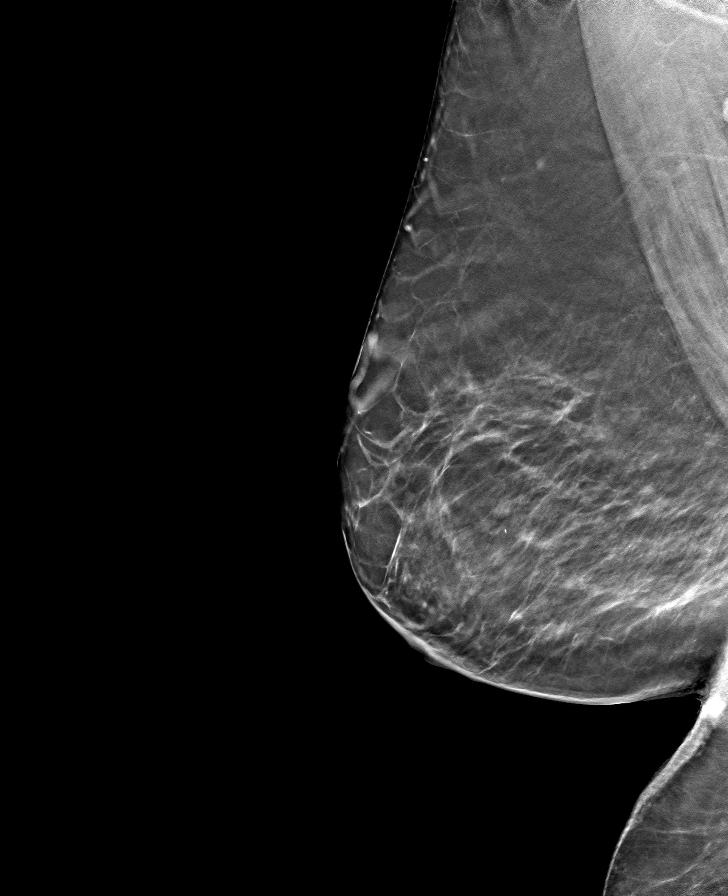

[8 of 24 positions shown; findings below may reference images not displayed]

ACR Breast Density Category b: There are scattered areas of
fibroglandular density.
FINDINGS: There are no findings suspicious for malignancy.
IMPRESSION: No mammographic evidence of malignancy. A result letter of this
screening mammogram will be mailed directly to the patient.

RECOMMENDATION:
Screening mammogram in one year. (Code:51-O-LD2)

BI-RADS CATEGORY  1: Negative.

## 2023-05-24 ENCOUNTER — Ambulatory Visit: Payer: Managed Care, Other (non HMO) | Admitting: Pediatrics

## 2023-05-24 VITALS — BP 99/64 | HR 63 | Temp 97.9°F | Resp 16 | Wt 198.0 lb

## 2023-05-24 DIAGNOSIS — Z23 Encounter for immunization: Secondary | ICD-10-CM

## 2023-05-24 DIAGNOSIS — B070 Plantar wart: Secondary | ICD-10-CM | POA: Diagnosis not present

## 2023-05-24 NOTE — Patient Instructions (Signed)
Will call you once I get the freezing spray

## 2023-05-24 NOTE — Progress Notes (Unsigned)
Acute Visit  BP 99/64 (BP Location: Left Arm, Patient Position: Sitting, Cuff Size: Large)   Pulse 63   Temp 97.9 F (36.6 C) (Oral)   Resp 16   Wt 198 lb (89.8 kg)   SpO2 98%   BMI 38.67 kg/m    Subjective:    Patient ID: Susan Pineda, female    DOB: Nov 10, 1955, 67 y.o.   MRN: [id number]  HPI: Susan Pineda is a 67 y.o. female  Chief Complaint  Patient presents with   Left foot    ? Plantar wart, felt like something was in her shoe/sock. Started about a month ago.    Discussed the use of AI scribe software for clinical note transcription with the patient, who gave verbal consent to proceed.  History of Present Illness   The patient, with a history of diabetes, presents with a new onset of foot discomfort that started approximately a month ago. The discomfort is described as a stabbing pain, similar to having a splinter, located on the outside of the left foot. The patient denies any preceding tingling sensation. The pain has since evolved to a tender, bruised-like sensation, particularly noticeable when walking on concrete floors for extended periods during work shifts. The patient denies any similar episodes in the past.  Upon self-examination, the patient was unable to visualize the area well due to its location. The patient's daughter examined the foot and suggested the presence of a wart. The patient denies any recent barefoot outdoor activities or potential for foreign body intrusion. The patient has been managing diabetes and has been conducting regular foot checks. The patient works three days a week, spending long hours on her feet. The discomfort is not bothersome during days off or when walking around in socks or flip-flops.      Relevant past medical, surgical, family and social history reviewed and updated as indicated. Interim medical history since our last visit reviewed. Allergies and medications reviewed and updated.  ROS per HPI unless specifically indicated  above     Objective:    BP 99/64 (BP Location: Left Arm, Patient Position: Sitting, Cuff Size: Large)   Pulse 63   Temp 97.9 F (36.6 C) (Oral)   Resp 16   Wt 198 lb (89.8 kg)   SpO2 98%   BMI 38.67 kg/m   Wt Readings from Last 3 Encounters:  05/24/23 198 lb (89.8 kg)  02/01/23 191 lb 6.4 oz (86.8 kg)  08/01/22 196 lb 11.2 oz (89.2 kg)     Physical Exam Constitutional:      Appearance: Normal appearance.  Pulmonary:     Effort: Pulmonary effort is normal.  Musculoskeletal:        General: Normal range of motion.  Skin:    Comments: Small hyperkeratotic verrucous papule on left plantar foot (see photo below)  Neurological:     General: No focal deficit present.     Mental Status: She is alert. Mental status is at baseline.  Psychiatric:        Mood and Affect: Mood normal.        Behavior: Behavior normal.        Thought Content: Thought content normal.         Assessment & Plan:  Assessment & Plan   Plantar wart New onset of a painful lesion on the left foot, initially presenting with stabbing pain, now more tender. No history of similar lesions. Diabetic patient with good foot care habits. C/f plantar wart. -  Plan to freeze the wart, unavailable liquid nitrogen machine but will schedule treatment as soon as possible. - can use salicylic acid in the mealtime OTC and rx sent -     Salicylic Acid; Follow instructions on packaging.  Dispense: 1 each; Refill: 0  Need for vaccination -     Flu Vaccine Trivalent High Dose (Fluad)  Follow up plan: Return pending cryotherapy availability.  Aleane Wesenberg Howell Pringle, MD

## 2023-05-25 MED ORDER — SALICYLIC ACID 40 % EX PADS: MEDICATED_PAD | CUTANEOUS | 0 refills | Status: AC

## 2023-05-27 ENCOUNTER — Encounter: Payer: Self-pay | Admitting: Pediatrics

## 2023-07-30 NOTE — Patient Instructions (Signed)
Be Involved in Caring For Your Health:  Taking Medications When medications are taken as directed, they can greatly improve your health. But if they are not taken as prescribed, they may not work. In some cases, not taking them correctly can be harmful. To help ensure your treatment remains effective and safe, understand your medications and how to take them. Bring your medications to each visit for review by your provider.  Your lab results, notes, and after visit summary will be available on My Chart. We strongly encourage you to use this feature. If lab results are abnormal the clinic will contact you with the appropriate steps. If the clinic does not contact you assume the results are satisfactory. You can always view your results on My Chart. If you have questions regarding your health or results, please contact the clinic during office hours. You can also ask questions on My Chart.  We at [facility] are grateful that you chose [place] to provide your care. We strive to provide evidence-based and compassionate care and are always looking for feedback. If you get a survey from the clinic please complete this so we can hear your opinions.  Diabetes Mellitus and Foot Care Diabetes, also called diabetes mellitus, may cause problems with your feet and legs because of poor blood flow (circulation). Poor circulation may make your skin: Become thinner and drier. Break more easily. Heal more slowly. Peel and crack. You may also have nerve damage (neuropathy). This can cause decreased feeling in your legs and feet. This means that you may not notice minor injuries to your feet that could lead to more serious problems. Finding and treating problems early is the best way to prevent future foot problems. How to care for your feet Foot hygiene  Wash your feet daily with warm water and mild soap. Do not use hot water. Then, pat your feet and the areas between your toes until they are fully dry. Do  not soak your feet. This can dry your skin. Trim your toenails straight across. Do not dig under them or around the cuticle. File the edges of your nails with an emery board or nail file. Apply a moisturizing lotion or petroleum jelly to the skin on your feet and to dry, brittle toenails. Use lotion that does not contain alcohol and is unscented. Do not apply lotion between your toes. Shoes and socks Wear clean socks or stockings every day. Make sure they are not too tight. Do not wear knee-high stockings. These may decrease blood flow to your legs. Wear shoes that fit well and have enough cushioning. Always look in your shoes before you put them on to be sure there are no objects inside. To break in new shoes, wear them for just a few hours a day. This prevents injuries on your feet. Wounds, scrapes, corns, and calluses  Check your feet daily for blisters, cuts, bruises, sores, and redness. If you cannot see the bottom of your feet, use a mirror or ask someone for help. Do not cut off corns or calluses or try to remove them with medicine. If you find a minor scrape, cut, or break in the skin on your feet, keep it and the skin around it clean and dry. You may clean these areas with mild soap and water. Do not clean the area with peroxide, alcohol, or iodine. If you have a wound, scrape, corn, or callus on your foot, look at it several times a day to make sure it  is healing and not infected. Check for: Redness, swelling, or pain. Fluid or blood. Warmth. Pus or a bad smell. General tips Do not cross your legs. This may decrease blood flow to your feet. Do not use heating pads or hot water bottles on your feet. They may burn your skin. If you have lost feeling in your feet or legs, you may not know this is happening until it is too late. Protect your feet from hot and cold by wearing shoes, such as at the beach or on hot pavement. Schedule a complete foot exam at least once a year or more often if  you have foot problems. Report any cuts, sores, or bruises to your health care provider right away. Where to find more information American Diabetes Association: diabetes.org Association of Diabetes Care & Education Specialists: diabeteseducator.org Contact a health care provider if: You have a condition that increases your risk of infection, and you have any cuts, sores, or bruises on your feet. You have an injury that is not healing. You have redness on your legs or feet. You feel burning or tingling in your legs or feet. You have pain or cramps in your legs and feet. Your legs or feet are numb. Your feet always feel cold. You have pain around any toenails. Get help right away if: You have a wound, scrape, corn, or callus on your foot and: You have signs of infection. You have a fever. You have a red line going up your leg. This information is not intended to replace advice given to you by your health care provider. Make sure you discuss any questions you have with your health care provider. Document Revised: 12/22/2021 Document Reviewed: 12/22/2021 Elsevier Patient Education  2024 [employer].

## 2023-08-03 ENCOUNTER — Ambulatory Visit: Payer: Managed Care, Other (non HMO) | Admitting: Nurse Practitioner

## 2023-08-03 ENCOUNTER — Encounter: Payer: Self-pay | Admitting: Nurse Practitioner

## 2023-08-03 VITALS — BP 106/67 | HR 64 | Temp 98.0°F | Ht 60.0 in | Wt 194.6 lb

## 2023-08-03 DIAGNOSIS — Z1231 Encounter for screening mammogram for malignant neoplasm of breast: Secondary | ICD-10-CM

## 2023-08-03 DIAGNOSIS — E1169 Type 2 diabetes mellitus with other specified complication: Secondary | ICD-10-CM | POA: Diagnosis not present

## 2023-08-03 DIAGNOSIS — E890 Postprocedural hypothyroidism: Secondary | ICD-10-CM | POA: Diagnosis not present

## 2023-08-03 DIAGNOSIS — M810 Age-related osteoporosis without current pathological fracture: Secondary | ICD-10-CM | POA: Diagnosis not present

## 2023-08-03 DIAGNOSIS — E66811 Obesity, class 1: Secondary | ICD-10-CM

## 2023-08-03 DIAGNOSIS — E669 Obesity, unspecified: Secondary | ICD-10-CM

## 2023-08-03 DIAGNOSIS — E1159 Type 2 diabetes mellitus with other circulatory complications: Secondary | ICD-10-CM

## 2023-08-03 DIAGNOSIS — I152 Hypertension secondary to endocrine disorders: Secondary | ICD-10-CM

## 2023-08-03 DIAGNOSIS — E785 Hyperlipidemia, unspecified: Secondary | ICD-10-CM

## 2023-08-03 DIAGNOSIS — E119 Type 2 diabetes mellitus without complications: Secondary | ICD-10-CM

## 2023-08-03 DIAGNOSIS — E6609 Other obesity due to excess calories: Secondary | ICD-10-CM

## 2023-08-03 DIAGNOSIS — Z6834 Body mass index (BMI) 34.0-34.9, adult: Secondary | ICD-10-CM

## 2023-08-03 LAB — BAYER DCA HB A1C WAIVED: HB A1C (BAYER DCA - WAIVED): 6.3 % — ABNORMAL HIGH (ref 4.8–5.6)

## 2023-08-03 LAB — MICROALBUMIN, URINE WAIVED
Creatinine, Urine Waived: 50 mg/dL (ref 10–300)
Microalb, Ur Waived: 30 mg/L — ABNORMAL HIGH (ref 0–19)

## 2023-08-03 MED ORDER — CONTOUR NEXT TEST VI STRP: ORAL_STRIP | 12 refills | Status: DC

## 2023-08-03 NOTE — Assessment & Plan Note (Signed)
Chronic, ongoing with poor tolerance to Fosamax -- caused severe muscle pain.  Continues to tolerate Prolia.  She has history of fracture to humerus in March 2023.  Will continue Prolia injections every 6 months to help prevent fracture and treat osteoporosis.  Educated her at length on this medication use and side effects to report.  Continue daily calcium and Vit D -- history of parathyroid removal.  Next DEXA due March 2025.

## 2023-08-03 NOTE — Assessment & Plan Note (Signed)
History of thyroid cancer with postprocedural hypothyroidism.  Continue current medication regimen and adjust as needed.

## 2023-08-03 NOTE — Progress Notes (Signed)
BP 106/67   Pulse 64   Temp 98 F (36.7 C) (Oral)   Ht 5' (1.524 m)   Wt 194 lb 9.6 oz (88.3 kg)   SpO2 99%   BMI 38.01 kg/m    Subjective:    Patient ID: Susan Pineda, female    DOB: June 09, 1956, 68 y.o.   MRN: [id number]  HPI: Susan Pineda is a 68 y.o. female  Chief Complaint  Patient presents with   Diabetes   Hyperlipidemia   Hypertension   DIABETES Last A1c 6.5%.  Continues on Metformin 500 MG BID.  Hypoglycemic episodes:no Polydipsia/polyuria: no Visual disturbance: no Chest pain: no Paresthesias: no Glucose Monitoring: yes             Accucheck frequency: once a week             Fasting glucose: <130 on average             Post prandial:             Evening:             Before meals: Taking Insulin?: no             Long acting insulin:             Short acting insulin: Blood Pressure Monitoring: not checking Retinal Examination: Up To Date - [place] Foot Exam: Up to Date Pneumovax: Up To Date Influenza: Up to Date Aspirin: yes    HYPERTENSION / HYPERLIPIDEMIA Taking Lisinopril 5 MG daily and Pravastatin 40 MG. Satisfied with current treatment? yes Duration of hypertension: chronic BP monitoring frequency: not checking BP range:  BP medication side effects: no Duration of hyperlipidemia: chronic Cholesterol medication side effects: no Cholesterol supplements: none Medication compliance: good compliance Aspirin: yes Recent stressors: no Recurrent headaches: no Visual changes: no Palpitations: no Dyspnea: no Chest pain: no Lower extremity edema: no Dizzy/lightheaded: no    HYPOTHYROIDISM Takes Levothyroxine 88 MCG.  Had thyroid removed in 2012.  Saw Dr. Tedd Sias last on 07/29/20, per them PCP could take over monitoring. Satisfied with current treatment? yes Medication side effects: no Medication compliance: good compliance Etiology of hypothyroidism:  Recent dose adjustment:no Fatigue: no Cold intolerance: no Heat intolerance: no Weight  gain: no Weight loss: no Constipation: no Diarrhea/loose stools: no Palpitations: no Lower extremity edema: no Anxiety/depressed mood: no  OSTEOPOROSIS 01/26/22 BMD measured at Femur Neck Right is 0.697 g/cm2 with a T-score of -2.5. >1 year since her last fracture.  Continues on Prolia with tolerance. Satisfied with current treatment?: yes Medication side effects: no Medication compliance: good compliance Past osteoporosis medications/treatments: Fosamax Adequate calcium & vitamin D: yes Intolerance to bisphosphonates:yes Weight bearing exercises: yes      08/03/2023    8:39 AM 05/24/2023    1:34 PM 02/01/2023    8:39 AM 08/01/2022    9:55 AM 03/23/2022    9:16 AM  Depression screen PHQ 2/9  Decreased Interest 0 0 0 0 0  Down, Depressed, Hopeless 0 0 0 0 0  PHQ - 2 Score 0 0 0 0 0  Altered sleeping 0 0 0 0 0  Tired, decreased energy 0 0 0 0 0  Change in appetite 0 0 0 0 0  Feeling bad or failure about yourself  0 0 0 0 0  Trouble concentrating 0 0 0 0 0  Moving slowly or fidgety/restless 0 0 0 0 0  Suicidal thoughts 0 0 0 0 0  PHQ-9  Score 0 0 0 0 0  Difficult doing work/chores Not difficult at all Not difficult at all Not difficult at all Not difficult at all Not difficult at all       08/03/2023    8:39 AM 05/24/2023    1:34 PM 02/01/2023    8:39 AM 08/01/2022    9:55 AM  GAD 7 : Generalized Anxiety Score  Nervous, Anxious, on Edge 0 0 0 0  Control/stop worrying 0 0 0 0  Worry too much - different things 0 0 0 0  Trouble relaxing 0 0 0 0  Restless 0 0 0 0  Easily annoyed or irritable 0 0 0 0  Afraid - awful might happen 0 0 0 0  Total GAD 7 Score 0 0 0 0  Anxiety Difficulty Not difficult at all Not difficult at all Not difficult at all Not difficult at all    Relevant past medical, surgical, family and social history reviewed and updated as indicated. Interim medical history since our last visit reviewed. Allergies and medications reviewed and updated.  Review of  Systems  Constitutional:  Negative for activity change, appetite change, diaphoresis, fatigue and fever.  Respiratory:  Negative for cough, chest tightness and shortness of breath.   Cardiovascular:  Negative for chest pain, palpitations and leg swelling.  Gastrointestinal: Negative.  Negative for vomiting.  Endocrine: Negative for cold intolerance, heat intolerance, polydipsia, polyphagia and polyuria.  Neurological: Negative.   Psychiatric/Behavioral: Negative.      Per HPI unless specifically indicated above     Objective:    BP 106/67   Pulse 64   Temp 98 F (36.7 C) (Oral)   Ht 5' (1.524 m)   Wt 194 lb 9.6 oz (88.3 kg)   SpO2 99%   BMI 38.01 kg/m   Wt Readings from Last 3 Encounters:  08/03/23 194 lb 9.6 oz (88.3 kg)  05/24/23 198 lb (89.8 kg)  02/01/23 191 lb 6.4 oz (86.8 kg)    Physical Exam Vitals and nursing note reviewed.  Constitutional:      General: She is awake. She is not in acute distress.    Appearance: She is well-developed and well-groomed. She is obese. She is not ill-appearing.  HENT:     Head: Normocephalic.     Right Ear: Hearing normal.     Left Ear: Hearing normal.  Eyes:     General: Lids are normal.        Right eye: No discharge.        Left eye: No discharge.     Conjunctiva/sclera: Conjunctivae normal.     Pupils: Pupils are equal, round, and reactive to light.  Neck:     Thyroid: No thyromegaly.     Vascular: No carotid bruit.  Cardiovascular:     Rate and Rhythm: Normal rate and regular rhythm.     Heart sounds: Normal heart sounds. No murmur heard.    No gallop.  Pulmonary:     Effort: Pulmonary effort is normal. No accessory muscle usage or respiratory distress.     Breath sounds: Normal breath sounds.  Abdominal:     General: Bowel sounds are normal.     Palpations: Abdomen is soft.  Musculoskeletal:     Cervical back: Normal range of motion and neck supple.     Right lower leg: No edema.     Left lower leg: No edema.   Lymphadenopathy:     Cervical: No cervical adenopathy.  Skin:  General: Skin is warm and dry.  Neurological:     Mental Status: She is alert and oriented to person, place, and time.  Psychiatric:        Attention and Perception: Attention normal.        Mood and Affect: Mood normal.        Speech: Speech normal.        Behavior: Behavior normal. Behavior is cooperative.        Thought Content: Thought content normal.    Diabetic Foot Exam - Simple   Simple Foot Form Visual Inspection See comments: Yes Sensation Testing Intact to touch and monofilament testing bilaterally: Yes Pulse Check Posterior Tibialis and Dorsalis pulse intact bilaterally: Yes Comments 1+ DP and PT bilaterally.  Sensation 10/10 both feet.  Overall intact skin, one are to bunion on left foot with mild erythema - discussed with her to monitor this area due to being pressure point.    Results for orders placed or performed in visit on 02/20/23  HM DIABETES EYE EXAM   Collection Time: 02/20/23 12:00 AM  Result Value Ref Range   HM Diabetic Eye Exam No Retinopathy No Retinopathy      Assessment & Plan:   Problem List Items Addressed This Visit       Cardiovascular and Mediastinum   Hypertension associated with diabetes (HCC)   Chronic, stable.  BP remains well below goal in office.  Could consider cutting back on Lisinopril at future visits to 2.5 MG if maintains stable control, keep on board though as urine ALB 80 January 2024, rechecking today.  Would like to keep on board for kidney protection.  Recommend she monitor BP at least a few mornings a week at home and document.  DASH diet at home.  Labs today: CMP.         Relevant Orders   Bayer DCA Hb A1c Waived   Microalbumin, Urine Waived   Comprehensive metabolic panel     Endocrine   Hyperlipidemia associated with type 2 diabetes mellitus (HCC)   Chronic, stable with A1c remaining <7%.  Continue current medication regimen and adjust as  needed.  Lipid panel today.       Relevant Orders   Bayer DCA Hb A1c Waived   Comprehensive metabolic panel   Lipid Panel w/o Chol/HDL Ratio   Postprocedural hypothyroidism   History of thyroid cancer with postprocedural hypothyroidism.  Continue current medication regimen and adjust as needed.        Type 2 diabetes mellitus with obesity (HCC) - Primary   Chronic, stable.  A1c 6.3% today, staying well at goal and urine ALB 80 January 2024 -- maintain Lisinopril and recheck today.  Continue current medication regimen and blood sugar checks at home.  Refills sent in.  Recommend heavy focus on diet changes at home.  Return in 6 months.   - Foot exam up to date.  Eye exam up to date. - ACE and statin on board - Vaccinations up to date with exception of Shingrix.      Relevant Orders   Bayer DCA Hb A1c Waived   Microalbumin, Urine Waived     Musculoskeletal and Integument   Osteoporosis   Chronic, ongoing with poor tolerance to Fosamax -- caused severe muscle pain.  Continues to tolerate Prolia.  She has history of fracture to humerus in March 2023.  Will continue Prolia injections every 6 months to help prevent fracture and treat osteoporosis.  Educated her at length on  this medication use and side effects to report.  Continue daily calcium and Vit D -- history of parathyroid removal.  Next DEXA due March 2025.      Relevant Orders   DG Bone Density     Other   Obesity   BMI 38.01. Recommended eating smaller high protein, low fat meals more frequently and exercising 30 mins a day 5 times a week with a goal of 10-15lb weight loss in the next 3 months. Patient voiced their understanding and motivation to adhere to these recommendations.       Other Visit Diagnoses       Encounter for screening mammogram for malignant neoplasm of breast       Mammogram ordered and patient aware how to schedule.   Relevant Orders   MM 3D SCREENING MAMMOGRAM BILATERAL BREAST        Follow up  plan: Return in about 6 months (around 01/31/2024) for Annual Physical after 02/01/24.

## 2023-08-03 NOTE — Assessment & Plan Note (Signed)
Chronic, stable with A1c remaining <7%.  Continue current medication regimen and adjust as needed.  Lipid panel today.

## 2023-08-03 NOTE — Assessment & Plan Note (Addendum)
Chronic, stable.  A1c 6.3% today, staying well at goal and urine ALB 80 January 2024 -- maintain Lisinopril and recheck today.  Continue current medication regimen and blood sugar checks at home.  Refills sent in.  Recommend heavy focus on diet changes at home.  Return in 6 months.   - Foot exam up to date.  Eye exam up to date. - ACE and statin on board - Vaccinations up to date with exception of Shingrix.

## 2023-08-03 NOTE — Assessment & Plan Note (Signed)
BMI 38.01. Recommended eating smaller high protein, low fat meals more frequently and exercising 30 mins a day 5 times a week with a goal of 10-15lb weight loss in the next 3 months. Patient voiced their understanding and motivation to adhere to these recommendations.

## 2023-08-03 NOTE — Assessment & Plan Note (Signed)
Chronic, stable.  BP remains well below goal in office.  Could consider cutting back on Lisinopril at future visits to 2.5 MG if maintains stable control, keep on board though as urine ALB 80 January 2024, rechecking today.  Would like to keep on board for kidney protection.  Recommend she monitor BP at least a few mornings a week at home and document.  DASH diet at home.  Labs today: CMP.

## 2023-08-04 ENCOUNTER — Encounter: Payer: Self-pay | Admitting: Nurse Practitioner

## 2023-08-04 LAB — COMPREHENSIVE METABOLIC PANEL
ALT: 12 [IU]/L (ref 0–32)
AST: 16 [IU]/L (ref 0–40)
Albumin: 4.3 g/dL (ref 3.9–4.9)
Alkaline Phosphatase: 84 [IU]/L (ref 44–121)
BUN/Creatinine Ratio: 18 (ref 12–28)
BUN: 14 mg/dL (ref 8–27)
Bilirubin Total: 0.3 mg/dL (ref 0.0–1.2)
CO2: 23 mmol/L (ref 20–29)
Calcium: 9.7 mg/dL (ref 8.7–10.3)
Chloride: 100 mmol/L (ref 96–106)
Creatinine, Ser: 0.76 mg/dL (ref 0.57–1.00)
Globulin, Total: 2.3 g/dL (ref 1.5–4.5)
Glucose: 107 mg/dL — ABNORMAL HIGH (ref 70–99)
Potassium: 4.4 mmol/L (ref 3.5–5.2)
Sodium: 138 mmol/L (ref 134–144)
Total Protein: 6.6 g/dL (ref 6.0–8.5)
eGFR: 86 mL/min/{1.73_m2} (ref >59)

## 2023-08-04 LAB — LIPID PANEL W/O CHOL/HDL RATIO
Cholesterol, Total: 135 mg/dL (ref 100–199)
HDL: 40 mg/dL (ref >39)
LDL Chol Calc (NIH): 75 mg/dL (ref 0–99)
Triglycerides: 111 mg/dL (ref 0–149)
VLDL Cholesterol Cal: 20 mg/dL (ref 5–40)

## 2023-08-04 NOTE — Progress Notes (Signed)
Contacted via MyChart   Good morning Channah, your labs have returned: - Kidney function, creatinine and eGFR, remains normal, as is liver function, AST and ALT.  - Lipid panel looks fabulous!!  Any questions?  No medication changes.  Thyroid labs will be at next visit.:) Keep being stellar!!  Thank you for allowing me to participate in your care.  I appreciate you. Kindest regards, Vicke Plotner

## 2023-12-22 ENCOUNTER — Encounter: Payer: Self-pay | Admitting: Nurse Practitioner

## 2024-02-10 NOTE — Patient Instructions (Signed)
Be Involved in Caring For Your Health:  Taking Medications When medications are taken as directed, they can greatly improve your health. But if they are not taken as prescribed, they may not work. In some cases, not taking them correctly can be harmful. To help ensure your treatment remains effective and safe, understand your medications and how to take them. Bring your medications to each visit for review by your provider.  Your lab results, notes, and after visit summary will be available on My Chart. We strongly encourage you to use this feature. If lab results are abnormal the clinic will contact you with the appropriate steps. If the clinic does not contact you assume the results are satisfactory. You can always view your results on My Chart. If you have questions regarding your health or results, please contact the clinic during office hours. You can also ask questions on My Chart.  We at [facility] are grateful that you chose [place] to provide your care. We strive to provide evidence-based and compassionate care and are always looking for feedback. If you get a survey from the clinic please complete this so we can hear your opinions.  Diabetes Mellitus and Exercise Regular exercise is important for your health, especially if you have diabetes mellitus. Exercise is not just about losing weight. It can also help you increase muscle strength and bone density and reduce body fat and stress. This can help your level of endurance and make you more fit and flexible. Why should I exercise if I have diabetes? Exercise has many benefits for people with diabetes. It can: Help lower and control your blood sugar (glucose). Help your body respond better and become more sensitive to the hormone insulin. Reduce how much insulin your body needs. Lower your risk for heart disease by: Lowering how much "bad" cholesterol and triglycerides you have in your body. Increasing how much "good" cholesterol  you have in your body. Lowering your blood pressure. Lowering your blood glucose levels. What is my activity plan? Your health care provider or an expert trained in diabetes care (certified diabetes educator) can help you make an activity plan. This plan can help you find the type of exercise that works for you. It may also tell you how often to exercise and for how long. Be sure to: Get at least 150 minutes of medium-intensity or high-intensity exercise each week. This may involve brisk walking, biking, or water aerobics. Do stretching and strengthening exercises at least 2 times a week. This may involve yoga or weight lifting. Spread out your activity over at least 3 days of the week. Get some form of physical activity each day. Do not go more than 2 days in a row without some kind of activity. Avoid being inactive for more than 30 minutes at a time. Take frequent breaks to walk or stretch. Choose activities that you enjoy. Set goals that you know you can accomplish. Start slowly and increase the intensity of your exercise over time. How do I manage my diabetes during exercise?  Monitor your blood glucose Check your blood glucose before and after you exercise. If your blood glucose is 240 mg/dL (40.9 mmol/L) or higher before you exercise, check your urine for ketones. These are chemicals created by the liver. If you have ketones in your urine, do not exercise until your blood glucose returns to normal. If your blood glucose is 100 mg/dL (5.6 mmol/L) or lower, eat a snack that has 15-20 grams of carbohydrate in  it. Check your blood glucose 15 minutes after the snack to make sure that your level is above 100 mg/dL (5.6 mmol/L) before you start to exercise. Your risk for low blood glucose (hypoglycemia) goes up during and after exercise. Know the symptoms of this condition and how to treat it. Follow these instructions at home: Keep a carbohydrate snack on hand for use before, during, and after  exercise. This can help prevent or treat hypoglycemia. Avoid injecting insulin into parts of your body that are going to be used during exercise. This may include: Your arms, when you are going to play tennis. Your legs, when you are about to go jogging. Keep track of your exercise habits. This can help you and your health care provider watch and adjust your activity plan. Write down: What you eat before and after you exercise. Blood glucose levels before and after you exercise. The type and amount of exercise you do. Talk to your health care provider before you start a new activity. They may need to: Make sure that the activity is safe for you. Adjust your insulin, other medicines, and food that you eat. Drink water while you exercise. This can stop you from losing too much water (dehydration). It can also prevent problems caused by having a lot of heat in your body (heat stroke). Where to find more information American Diabetes Association: diabetes.org Association of Diabetes Care & Education Specialists: diabeteseducator.org This information is not intended to replace advice given to you by your health care provider. Make sure you discuss any questions you have with your health care provider. Document Revised: 12/08/2021 Document Reviewed: 12/08/2021 Elsevier Patient Education  2024 [employer].

## 2024-02-12 ENCOUNTER — Encounter: Payer: Self-pay | Admitting: Nurse Practitioner

## 2024-02-12 ENCOUNTER — Ambulatory Visit (INDEPENDENT_AMBULATORY_CARE_PROVIDER_SITE_OTHER): Admitting: Nurse Practitioner

## 2024-02-12 VITALS — BP 101/65 | HR 69 | Temp 98.6°F | Wt 195.2 lb

## 2024-02-12 DIAGNOSIS — E6609 Other obesity due to excess calories: Secondary | ICD-10-CM

## 2024-02-12 DIAGNOSIS — E669 Obesity, unspecified: Secondary | ICD-10-CM | POA: Diagnosis not present

## 2024-02-12 DIAGNOSIS — I152 Hypertension secondary to endocrine disorders: Secondary | ICD-10-CM

## 2024-02-12 DIAGNOSIS — M810 Age-related osteoporosis without current pathological fracture: Secondary | ICD-10-CM

## 2024-02-12 DIAGNOSIS — E1159 Type 2 diabetes mellitus with other circulatory complications: Secondary | ICD-10-CM | POA: Diagnosis not present

## 2024-02-12 DIAGNOSIS — E66811 Obesity, class 1: Secondary | ICD-10-CM

## 2024-02-12 DIAGNOSIS — E1169 Type 2 diabetes mellitus with other specified complication: Secondary | ICD-10-CM | POA: Diagnosis not present

## 2024-02-12 DIAGNOSIS — Z6834 Body mass index (BMI) 34.0-34.9, adult: Secondary | ICD-10-CM

## 2024-02-12 DIAGNOSIS — E890 Postprocedural hypothyroidism: Secondary | ICD-10-CM | POA: Diagnosis not present

## 2024-02-12 DIAGNOSIS — Z0000 Encounter for general adult medical examination without abnormal findings: Secondary | ICD-10-CM

## 2024-02-12 DIAGNOSIS — E785 Hyperlipidemia, unspecified: Secondary | ICD-10-CM

## 2024-02-12 LAB — BAYER DCA HB A1C WAIVED: HB A1C (BAYER DCA - WAIVED): 6.1 % — ABNORMAL HIGH (ref 4.8–5.6)

## 2024-02-12 MED ORDER — LEVOTHYROXINE SODIUM 88 MCG PO TABS: 88.0000 ug | ORAL_TABLET | Freq: Every day | ORAL | 4 refills | Status: AC

## 2024-02-12 MED ORDER — LISINOPRIL 5 MG PO TABS: 5.0000 mg | ORAL_TABLET | Freq: Every day | ORAL | 4 refills | Status: AC

## 2024-02-12 MED ORDER — METFORMIN HCL 500 MG PO TABS: 500.0000 mg | ORAL_TABLET | Freq: Two times a day (BID) | ORAL | 4 refills | Status: AC

## 2024-02-12 MED ORDER — ONETOUCH ULTRASOFT 2 LANCETS MISC: 1.0000 | 4 refills | Status: AC

## 2024-02-12 MED ORDER — PRAVASTATIN SODIUM 40 MG PO TABS: 40.0000 mg | ORAL_TABLET | Freq: Every day | ORAL | 4 refills | Status: AC

## 2024-02-12 MED ORDER — ONETOUCH ULTRA 2 W/DEVICE KIT: PACK | 0 refills | Status: AC

## 2024-02-12 MED ORDER — GLUCOSE BLOOD VI STRP: ORAL_STRIP | 12 refills | Status: AC

## 2024-02-12 NOTE — Assessment & Plan Note (Signed)
 BMI 38.12. Recommended eating smaller high protein, low fat meals more frequently and exercising 30 mins a day 5 times a week with a goal of 10-15lb weight loss in the next 3 months. Patient voiced their understanding and motivation to adhere to these recommendations.

## 2024-02-12 NOTE — Progress Notes (Signed)
 BP 101/65   Pulse 69   Temp 98.6 F (37 C) (Oral)   Wt 195 lb 3.2 oz (88.5 kg)   SpO2 98%   BMI 38.12 kg/m    Subjective:    Patient ID: Susan Pineda, female    DOB: September 21, 1955, 68 y.o.   MRN: [id number]  HPI: Susan Pineda is a 68 y.o. female presenting on 02/12/2024 for comprehensive medical examination. Current medical complaints include:none  She currently lives with: self Menopausal Symptoms: no   DIABETES A1c 6.3% in January. Taking Metformin  500 MG BID, but currently had to change drugstores and they have different manufacturer -- sugars have been a little higher with this..  Hypoglycemic episodes:no Polydipsia/polyuria: no Visual disturbance: no Chest pain: no Paresthesias: no Glucose Monitoring: yes             Accucheck frequency: every other day             Fasting glucose: 116 this morning             Post prandial:             Evening:             Before meals: Taking Insulin?: no             Long acting insulin:             Short acting insulin: Blood Pressure Monitoring: not checking Retinal Examination: Not Up To Date -- Mevelyn on August 19th Foot Exam: Up to Date Pneumovax: Up To Date Influenza: Up to Date Aspirin: yes    HYPERTENSION / HYPERLIPIDEMIA Taking Lisinopril  5 MG daily and Pravastatin  40 MG. Satisfied with current treatment? yes Duration of hypertension: chronic BP monitoring frequency: not checking BP range:  BP medication side effects: no Duration of hyperlipidemia: chronic Cholesterol medication side effects: no Cholesterol supplements: none Medication compliance: good compliance Aspirin: yes Recent stressors: no Recurrent headaches: no Visual changes: no Palpitations: no Dyspnea: no Chest pain: no Lower extremity edema: no Dizzy/lightheaded: no    HYPOTHYROIDISM Thyroid  removed in 2012.  Taking Levothyroxine  88 MCG.  In past had visits with endo, Dr. Damian, last saw 08/06/21 -- then PCP took over. Thyroid  control  status:stable Satisfied with current treatment? yes Medication side effects: no Medication compliance: good compliance Etiology of hypothyroidism: surgery Recent dose adjustment:no Fatigue: no Cold intolerance: no Heat intolerance: no Weight gain: no Weight loss: no Constipation: no Diarrhea/loose stools: no Palpitations: no Lower extremity edema: no Anxiety/depressed mood: no  OSTEOPOROSIS Continues Prolia , did one shot.  DEXA 01/06/22 with T-score -2.5.  Had fracture humerus and shoulder in March 2023 after injury at home.   Past osteoporosis medications/treatments: none Adequate calcium & vitamin D : yes Intolerance to bisphosphonates:no Weight bearing exercises: yes   Depression Screen done today and results listed below:     02/12/2024    1:38 PM 08/03/2023    8:39 AM 05/24/2023    1:34 PM 02/01/2023    8:39 AM 08/01/2022    9:55 AM  Depression screen PHQ 2/9  Decreased Interest 0 0 0 0 0  Down, Depressed, Hopeless 0 0 0 0 0  PHQ - 2 Score 0 0 0 0 0  Altered sleeping 0 0 0 0 0  Tired, decreased energy 0 0 0 0 0  Change in appetite 0 0 0 0 0  Feeling bad or failure about yourself  0 0 0 0 0  Trouble concentrating 0 0 0 0  0  Moving slowly or fidgety/restless 0 0 0 0 0  Suicidal thoughts 0 0 0 0 0  PHQ-9 Score 0 0 0 0 0  Difficult doing work/chores Not difficult at all Not difficult at all Not difficult at all Not difficult at all Not difficult at all      02/12/2024    1:39 PM 08/03/2023    8:39 AM 05/24/2023    1:34 PM 02/01/2023    8:39 AM  GAD 7 : Generalized Anxiety Score  Nervous, Anxious, on Edge 0 0 0 0  Control/stop worrying 0 0 0 0  Worry too much - different things 0 0 0 0  Trouble relaxing 0 0 0 0  Restless 0 0 0 0  Easily annoyed or irritable 0 0 0 0  Afraid - awful might happen 0 0 0 0  Total GAD 7 Score 0 0 0 0  Anxiety Difficulty Not difficult at all Not difficult at all Not difficult at all Not difficult at all      01/26/2022    9:48 AM  03/23/2022    9:16 AM 02/01/2023    8:39 AM 08/03/2023    8:39 AM 02/12/2024    1:38 PM  Fall Risk  Falls in the past year? 1 0 0 0 0  Was there an injury with Fall? 1 0 0 0 0  Fall Risk Category Calculator 2 0 0 0 0  Fall Risk Category (Retired) Moderate  Low      (RETIRED) Patient Fall Risk Level Moderate fall risk  Low fall risk      Patient at Risk for Falls Due to Orthopedic patient No Fall Risks No Fall Risks No Fall Risks No Fall Risks  Fall risk Follow up Falls evaluation completed  Falls evaluation completed  Falls evaluation completed Falls evaluation completed Falls evaluation completed     Data saved with a previous flowsheet row definition    Functional Status Survey: Is the patient deaf or have difficulty hearing?: No Does the patient have difficulty seeing, even when wearing glasses/contacts?: No Does the patient have difficulty concentrating, remembering, or making decisions?: No Does the patient have difficulty walking or climbing stairs?: No Does the patient have difficulty dressing or bathing?: No Does the patient have difficulty doing errands alone such as visiting a doctor's office or shopping?: No   Past Medical History:  Past Medical History:  Diagnosis Date   Anemia    h/o   Cancer (HCC) 2012   THYROID  [place]   Chronic kidney disease    KIDNEY STONES   Diabetes mellitus without complication (HCC)    Diverticulitis    GERD (gastroesophageal reflux disease)    Hyperlipidemia    Hypertension    Hypothyroidism     Surgical History:  Past Surgical History:  Procedure Laterality Date   COLON SURGERY     HERNIA REPAIR     HERNIA REPAIR  2012, 2013   Dr Unknown Sharps   LITHOTRIPSY     X2   PARATHYROIDECTOMY Right    SIGMOIDECTOMY  07/04/2009   colovaginal fistula/diverticulitis   SUBMANDIBULAR GLAND EXCISION Right 12/18/2014   Procedure: Right submandibular gland and subligual gland excision;  Surgeon: Carolee Hunter, MD;  Location: [facility] OR[facility];  Service:  ENT;  Laterality: Right;   THYROIDECTOMY     TOTAL ABDOMINAL HYSTERECTOMY  07/04/1997   VENTRAL HERNIA REPAIR N/A 06/02/2015   Procedure: HERNIA REPAIR VENTRAL ADULT, laparoscopic;  Surgeon: Reyes LELON Cota, MD;  Location:  [facility] OR[facility];  Service: General;  Laterality: N/A;    Medications:  Current Outpatient Medications on File Prior to Visit  Medication Sig   aspirin EC 81 MG tablet Take 81 mg by mouth daily.   Cholecalciferol (D-3-5) 125 MCG (5000 UT) capsule Take 5,000 Units by mouth daily.   Cinnamon 500 MG TABS Take 1 tablet by mouth daily.   Cranberry 1000 MG CAPS Take 1 capsule by mouth daily.   denosumab  (PROLIA ) 60 MG/ML SOSY injection Inject 60 mg into the skin every 6 (six) months.   Lancet Devices (MICROLET NEXT LANCING DEVICE) MISC Use to check blood sugar 2-3 times a week with goal <130 fasting in the morning and <180 two hours after eating.  Bring log to visits.   Salicylic Acid  40 % PADS Follow instructions on packaging.   No current facility-administered medications on file prior to visit.    Allergies:  No Known Allergies  Social History:  Social History   Socioeconomic History   Marital status: Divorced    Spouse name: Not on file   Number of children: Not on file   Years of education: Not on file   Highest education level: 12th grade  Occupational History   Not on file  Tobacco Use   Smoking status: Former    Current packs/day: 0.00    Average packs/day: 0.3 packs/day for 42.0 years (10.5 ttl pk-yrs)    Types: Cigarettes    Start date: 08/04/1973    Quit date: 08/05/2015    Years since quitting: 8.5   Smokeless tobacco: Never  Vaping Use   Vaping status: Never Used  Substance and Sexual Activity   Alcohol use: No    Alcohol/week: 0.0 standard drinks of alcohol   Drug use: No   Sexual activity: Not Currently  Other Topics Concern   Not on file  Social History Narrative   Not on file   Social Drivers of Health   Financial Resource Strain: Low Risk   (02/09/2024)   Overall Financial Resource Strain (CARDIA)    Difficulty of Paying Living Expenses: Not hard at all  Food Insecurity: No Food Insecurity (02/09/2024)   Hunger Vital Sign    Worried About Running Out of Food in the Last Year: Never true    Ran Out of Food in the Last Year: Never true  Transportation Needs: No Transportation Needs (02/09/2024)   PRAPARE - Administrator, Civil Service (Medical): No    Lack of Transportation (Non-Medical): No  Physical Activity: Insufficiently Active (02/09/2024)   Exercise Vital Sign    Days of Exercise per Week: 2 days    Minutes of Exercise per Session: 30 min  Stress: No Stress Concern Present (02/09/2024)   [employer] of Occupational Health - Occupational Stress Questionnaire    Feeling of Stress: Not at all  Social Connections: Moderately Isolated (02/09/2024)   Social Connection and Isolation Panel    Frequency of Communication with Friends and Family: More than three times a week    Frequency of Social Gatherings with Friends and Family: More than three times a week    Attends Religious Services: 1 to 4 times per year    Active Member of [employer] or Organizations: No    Attends Engineer, structural: Not on file    Marital Status: Divorced  Intimate Partner Violence: Not on file   Social History   Tobacco Use  Smoking Status Former   Current packs/day: 0.00   Average  packs/day: 0.3 packs/day for 42.0 years (10.5 ttl pk-yrs)   Types: Cigarettes   Start date: 08/04/1973   Quit date: 08/05/2015   Years since quitting: 8.5  Smokeless Tobacco Never   Social History   Substance and Sexual Activity  Alcohol Use No   Alcohol/week: 0.0 standard drinks of alcohol    Family History:  Family History  Problem Relation Age of Onset   Cancer Mother    Cancer Father    Breast cancer Neg Hx     Past medical history, surgical history, medications, allergies, family history and social history reviewed with patient today  and changes made to appropriate areas of the chart.   Review of Systems - negative All other ROS negative except what is listed above and in the HPI.      Objective:    BP 101/65   Pulse 69   Temp 98.6 F (37 C) (Oral)   Wt 195 lb 3.2 oz (88.5 kg)   SpO2 98%   BMI 38.12 kg/m   Wt Readings from Last 3 Encounters:  02/12/24 195 lb 3.2 oz (88.5 kg)  08/03/23 194 lb 9.6 oz (88.3 kg)  05/24/23 198 lb (89.8 kg)    Physical Exam Vitals and nursing note reviewed.  Constitutional:      General: She is awake. She is not in acute distress.    Appearance: She is well-developed and well-groomed. She is obese. She is not ill-appearing or toxic-appearing.  HENT:     Head: Normocephalic and atraumatic.     Right Ear: Hearing, tympanic membrane, ear canal and external ear normal. No drainage.     Left Ear: Hearing, tympanic membrane, ear canal and external ear normal. No drainage.     Nose: Nose normal.     Right Sinus: No maxillary sinus tenderness or frontal sinus tenderness.     Left Sinus: No maxillary sinus tenderness or frontal sinus tenderness.     Mouth/Throat:     Mouth: Mucous membranes are moist.     Pharynx: Oropharynx is clear. Uvula midline. No pharyngeal swelling, oropharyngeal exudate or posterior oropharyngeal erythema.  Eyes:     General: Lids are normal.        Right eye: No discharge.        Left eye: No discharge.     Extraocular Movements: Extraocular movements intact.     Conjunctiva/sclera: Conjunctivae normal.     Pupils: Pupils are equal, round, and reactive to light.     Visual Fields: Right eye visual fields normal and left eye visual fields normal.  Neck:     Thyroid : No thyromegaly.     Vascular: No carotid bruit.     Trachea: Trachea normal.  Cardiovascular:     Rate and Rhythm: Normal rate and regular rhythm.     Heart sounds: Normal heart sounds. No murmur heard.    No gallop.  Pulmonary:     Effort: Pulmonary effort is normal. No accessory muscle  usage or respiratory distress.     Breath sounds: Normal breath sounds.  Chest:     Comments: Deferred per patient request. Abdominal:     General: Bowel sounds are normal.     Palpations: Abdomen is soft. There is no hepatomegaly or splenomegaly.     Tenderness: There is no abdominal tenderness.  Musculoskeletal:        General: Normal range of motion.     Cervical back: Normal range of motion and neck supple.  Right lower leg: No edema.     Left lower leg: No edema.  Lymphadenopathy:     Head:     Right side of head: No submental, submandibular, tonsillar, preauricular or posterior auricular adenopathy.     Left side of head: No submental, submandibular, tonsillar, preauricular or posterior auricular adenopathy.     Cervical: No cervical adenopathy.  Skin:    General: Skin is warm and dry.     Capillary Refill: Capillary refill takes less than 2 seconds.     Findings: No rash.  Neurological:     Mental Status: She is alert and oriented to person, place, and time.     Gait: Gait is intact.     Deep Tendon Reflexes: Reflexes are normal and symmetric.     Reflex Scores:      Brachioradialis reflexes are 2+ on the right side and 2+ on the left side.      Patellar reflexes are 2+ on the right side and 2+ on the left side. Psychiatric:        Attention and Perception: Attention normal.        Mood and Affect: Mood normal.        Speech: Speech normal.        Behavior: Behavior normal. Behavior is cooperative.        Thought Content: Thought content normal.        Judgment: Judgment normal.    Results for orders placed or performed in visit on 08/03/23  Bayer DCA Hb A1c Waived   Collection Time: 08/03/23  8:45 AM  Result Value Ref Range   HB A1C (BAYER DCA - WAIVED) 6.3 (H) 4.8 - 5.6 %  Microalbumin, Urine Waived   Collection Time: 08/03/23  8:45 AM  Result Value Ref Range   Microalb, Ur Waived 30 (H) 0 - 19 mg/L   Creatinine, Urine Waived 50 10 - 300 mg/dL    Microalb/Creat Ratio 30-300 (H) <30 mg/g  Comprehensive metabolic panel   Collection Time: 08/03/23  8:45 AM  Result Value Ref Range   Glucose 107 (H) 70 - 99 mg/dL   BUN 14 8 - 27 mg/dL   Creatinine, Ser 9.23 0.57 - 1.00 mg/dL   eGFR 86 >40 fO/fpw/8.26   BUN/Creatinine Ratio 18 12 - 28   Sodium 138 134 - 144 mmol/L   Potassium 4.4 3.5 - 5.2 mmol/L   Chloride 100 96 - 106 mmol/L   CO2 23 20 - 29 mmol/L   Calcium 9.7 8.7 - 10.3 mg/dL   Total Protein 6.6 6.0 - 8.5 g/dL   Albumin 4.3 3.9 - 4.9 g/dL   Globulin, Total 2.3 1.5 - 4.5 g/dL   Bilirubin Total 0.3 0.0 - 1.2 mg/dL   Alkaline Phosphatase 84 44 - 121 IU/L   AST 16 0 - 40 IU/L   ALT 12 0 - 32 IU/L  Lipid Panel w/o Chol/HDL Ratio   Collection Time: 08/03/23  8:45 AM  Result Value Ref Range   Cholesterol, Total 135 100 - 199 mg/dL   Triglycerides 888 0 - 149 mg/dL   HDL 40 >60 mg/dL   VLDL Cholesterol Cal 20 5 - 40 mg/dL   LDL Chol Calc (NIH) 75 0 - 99 mg/dL      Assessment & Plan:   Problem List Items Addressed This Visit       Cardiovascular and Mediastinum   Hypertension associated with diabetes (HCC)   Chronic, stable.  BP remains well below  goal in office.  Could consider cutting back on Lisinopril  at future visits to 2.5 MG if maintains stable control, keep on board though as urine ALB 03 August 2023.  Would like to keep on board for kidney protection.  Recommend she monitor BP at least a few mornings a week at home and document.  DASH diet at home.  Labs today: CBC, TSH, CMP.         Relevant Medications   lisinopril  (ZESTRIL ) 5 MG tablet   metFORMIN  (GLUCOPHAGE ) 500 MG tablet   pravastatin  (PRAVACHOL ) 40 MG tablet   Other Relevant Orders   Bayer DCA Hb A1c Waived   CBC with Differential/Platelet   Comprehensive metabolic panel with GFR     Endocrine   Type 2 diabetes mellitus with obesity (HCC) - Primary   Chronic, stable.  A1c 6.1% today, staying well at goal and urine ALB 03 August 2023 -- maintain  Lisinopril .  Continue current medication regimen and blood sugar checks at home.  Refills sent in.  Recommend heavy focus on diet changes at home.  Return in 6 months.   - Foot exam up to date.  Eye exam up to date. - ACE and statin on board - Vaccinations up to date with exception of Shingrix.      Relevant Medications   lisinopril  (ZESTRIL ) 5 MG tablet   metFORMIN  (GLUCOPHAGE ) 500 MG tablet   pravastatin  (PRAVACHOL ) 40 MG tablet   Other Relevant Orders   Bayer DCA Hb A1c Waived   Postprocedural hypothyroidism   History of thyroid  cancer with postprocedural hypothyroidism.  Continue current medication regimen and adjust as needed.  Labs today.      Relevant Medications   levothyroxine  (SYNTHROID ) 88 MCG tablet   Other Relevant Orders   TSH   T4, free   Hyperlipidemia associated with type 2 diabetes mellitus (HCC)   Chronic, stable with A1c remaining <7%.  Continue current medication regimen and adjust as needed.  Lipid panel today.       Relevant Medications   lisinopril  (ZESTRIL ) 5 MG tablet   metFORMIN  (GLUCOPHAGE ) 500 MG tablet   pravastatin  (PRAVACHOL ) 40 MG tablet   Other Relevant Orders   Bayer DCA Hb A1c Waived   Comprehensive metabolic panel with GFR   Lipid Panel w/o Chol/HDL Ratio     Musculoskeletal and Integument   Osteoporosis   Chronic, ongoing with poor tolerance to Fosamax  -- caused severe muscle pain.  Continues to tolerate Prolia .  She has history of fracture to humerus in March 2023.  Will continue Prolia  injections every 6 months to help prevent fracture and treat osteoporosis, she has only taken one and will continue these if upcoming DEXA continues to show abnormal levels.  Educated her at length on this medication use and side effects to report.  Continue daily calcium and Vit D -- history of parathyroid removal.  DEXA upcoming.      Relevant Orders   VITAMIN D  25 Hydroxy (Vit-D Deficiency, Fractures)     Other   Obesity   BMI 38.12. Recommended  eating smaller high protein, low fat meals more frequently and exercising 30 mins a day 5 times a week with a goal of 10-15lb weight loss in the next 3 months. Patient voiced their understanding and motivation to adhere to these recommendations.       Relevant Medications   metFORMIN  (GLUCOPHAGE ) 500 MG tablet   Other Visit Diagnoses       Encounter for annual physical exam  Annual physical today with labs and health maintenance reviewed, discussed with patient.        Follow up plan: Return in about 6 months (around 08/14/2024) for WELCOME TO MEDICARE.   LABORATORY TESTING:  - Pap smear: not applicable  IMMUNIZATIONS:   - Tdap: Tetanus vaccination status reviewed: last tetanus booster within 10 years. - Influenza: Up to date - Pneumovax: Up to date - Prevnar: Up To Date - HPV: Not applicable - Zostavax vaccine: Refused  SCREENING: -Mammogram: Up to date - scheduled 03/13/24 - Colonoscopy: Cologuard due next 01/09/2026 - Bone Density: Up To Date -- 2023 Osteoporosis -- scheduled 03/13/24 -Hearing Test: Not applicable  -Spirometry: Not applicable   PATIENT COUNSELING:   Advised to take 1 mg of folate supplement per day if capable of pregnancy.   Sexuality: Discussed sexually transmitted diseases, partner selection, use of condoms, avoidance of unintended pregnancy  and contraceptive alternatives.   Advised to avoid cigarette smoking.  I discussed with the patient that most people either abstain from alcohol or drink within safe limits (<=14/week and <=4 drinks/occasion for males, <=7/weeks and <= 3 drinks/occasion for females) and that the risk for alcohol disorders and other health effects rises proportionally with the number of drinks per week and how often a drinker exceeds daily limits.  Discussed cessation/primary prevention of drug use and availability of treatment for abuse.   Diet: Encouraged to adjust caloric intake to maintain  or achieve ideal body weight, to  reduce intake of dietary saturated fat and total fat, to limit sodium intake by avoiding high sodium foods and not adding table salt, and to maintain adequate dietary potassium and calcium preferably from fresh fruits, vegetables, and low-fat dairy products.    Stressed the importance of regular exercise  Injury prevention: Discussed safety belts, safety helmets, smoke detector, smoking near bedding or upholstery.   Dental health: Discussed importance of regular tooth brushing, flossing, and dental visits.    NEXT PREVENTATIVE PHYSICAL DUE IN 1 YEAR. Return in about 6 months (around 08/14/2024) for WELCOME TO MEDICARE.

## 2024-02-12 NOTE — Assessment & Plan Note (Signed)
 Chronic, stable with A1c remaining <7%.  Continue current medication regimen and adjust as needed.  Lipid panel today.

## 2024-02-12 NOTE — Assessment & Plan Note (Signed)
 History of thyroid  cancer with postprocedural hypothyroidism.  Continue current medication regimen and adjust as needed.  Labs today.

## 2024-02-12 NOTE — Assessment & Plan Note (Signed)
 Chronic, stable.  A1c 6.1% today, staying well at goal and urine ALB 03 August 2023 -- maintain Lisinopril .  Continue current medication regimen and blood sugar checks at home.  Refills sent in.  Recommend heavy focus on diet changes at home.  Return in 6 months.   - Foot exam up to date.  Eye exam up to date. - ACE and statin on board - Vaccinations up to date with exception of Shingrix.

## 2024-02-12 NOTE — Assessment & Plan Note (Signed)
 Chronic, stable.  BP remains well below goal in office.  Could consider cutting back on Lisinopril  at future visits to 2.5 MG if maintains stable control, keep on board though as urine ALB 03 August 2023.  Would like to keep on board for kidney protection.  Recommend she monitor BP at least a few mornings a week at home and document.  DASH diet at home.  Labs today: CBC, TSH, CMP.

## 2024-02-12 NOTE — Assessment & Plan Note (Signed)
 Chronic, ongoing with poor tolerance to Fosamax  -- caused severe muscle pain.  Continues to tolerate Prolia .  She has history of fracture to humerus in March 2023.  Will continue Prolia  injections every 6 months to help prevent fracture and treat osteoporosis, she has only taken one and will continue these if upcoming DEXA continues to show abnormal levels.  Educated her at length on this medication use and side effects to report.  Continue daily calcium and Vit D -- history of parathyroid removal.  DEXA upcoming.

## 2024-02-13 ENCOUNTER — Encounter: Payer: Self-pay | Admitting: Nurse Practitioner

## 2024-02-13 ENCOUNTER — Ambulatory Visit: Payer: Self-pay | Admitting: Nurse Practitioner

## 2024-02-13 LAB — CBC WITH DIFFERENTIAL/PLATELET
Basophils Absolute: 0.1 x10E3/uL (ref 0.0–0.2)
Basos: 1 %
EOS (ABSOLUTE): 0.2 x10E3/uL (ref 0.0–0.4)
Eos: 1 %
Hematocrit: 48 % — ABNORMAL HIGH (ref 34.0–46.6)
Hemoglobin: 15.4 g/dL (ref 11.1–15.9)
Immature Grans (Abs): 0.1 x10E3/uL (ref 0.0–0.1)
Immature Granulocytes: 1 %
Lymphocytes Absolute: 3 x10E3/uL (ref 0.7–3.1)
Lymphs: 23 %
MCH: 30.2 pg (ref 26.6–33.0)
MCHC: 32.1 g/dL (ref 31.5–35.7)
MCV: 94 fL (ref 79–97)
Monocytes Absolute: 1.7 x10E3/uL — ABNORMAL HIGH (ref 0.1–0.9)
Monocytes: 13 %
Neutrophils Absolute: 8.3 x10E3/uL — ABNORMAL HIGH (ref 1.4–7.0)
Neutrophils: 61 %
Platelets: 410 x10E3/uL (ref 150–450)
RBC: 5.1 x10E6/uL (ref 3.77–5.28)
RDW: 13.3 % (ref 11.7–15.4)
WBC: 13.4 x10E3/uL — ABNORMAL HIGH (ref 3.4–10.8)

## 2024-02-13 LAB — COMPREHENSIVE METABOLIC PANEL WITH GFR
ALT: 21 IU/L (ref 0–32)
AST: 21 IU/L (ref 0–40)
Albumin: 4.5 g/dL (ref 3.9–4.9)
Alkaline Phosphatase: 79 IU/L (ref 44–121)
BUN/Creatinine Ratio: 19 (ref 12–28)
BUN: 14 mg/dL (ref 8–27)
Bilirubin Total: 0.6 mg/dL (ref 0.0–1.2)
CO2: 20 mmol/L (ref 20–29)
Calcium: 9.6 mg/dL (ref 8.7–10.3)
Chloride: 100 mmol/L (ref 96–106)
Creatinine, Ser: 0.75 mg/dL (ref 0.57–1.00)
Globulin, Total: 2.2 g/dL (ref 1.5–4.5)
Glucose: 76 mg/dL (ref 70–99)
Potassium: 4.6 mmol/L (ref 3.5–5.2)
Sodium: 137 mmol/L (ref 134–144)
Total Protein: 6.7 g/dL (ref 6.0–8.5)
eGFR: 88 mL/min/1.73 (ref >59)

## 2024-02-13 LAB — LIPID PANEL W/O CHOL/HDL RATIO
Cholesterol, Total: 127 mg/dL (ref 100–199)
HDL: 34 mg/dL — ABNORMAL LOW (ref >39)
LDL Chol Calc (NIH): 72 mg/dL (ref 0–99)
Triglycerides: 114 mg/dL (ref 0–149)
VLDL Cholesterol Cal: 21 mg/dL (ref 5–40)

## 2024-02-13 LAB — VITAMIN D 25 HYDROXY (VIT D DEFICIENCY, FRACTURES): Vit D, 25-Hydroxy: 56.7 ng/mL (ref 30.0–100.0)

## 2024-02-13 LAB — T4, FREE: Free T4: 1.88 ng/dL — ABNORMAL HIGH (ref 0.82–1.77)

## 2024-02-13 LAB — TSH: TSH: 1.12 u[IU]/mL (ref 0.450–4.500)

## 2024-02-13 NOTE — Progress Notes (Signed)
 Contacted via MyChart  Good morning Susan Pineda, your labs have returned: - Thyroid  shows normal TSH and mild elevation in Free T4 but this is coming down.  We will recheck next visit. Continue medication as ordered. - Kidney function, creatinine and eGFR, remains normal, as is liver function, AST and ALT.  - CBC is showing mild elevation in white blood cell count and neutrophils.  Have you been sick recently? If so this may be cause for elevations. - Remainder of labs look great, no medication changes needed.  Any questions? Keep being stellar!!  Thank you for allowing me to participate in your care.  I appreciate you. Kindest regards, Hadden Steig

## 2024-02-21 DIAGNOSIS — I10 Essential (primary) hypertension: Secondary | ICD-10-CM | POA: Diagnosis not present

## 2024-02-21 DIAGNOSIS — H5213 Myopia, bilateral: Secondary | ICD-10-CM | POA: Diagnosis not present

## 2024-02-21 DIAGNOSIS — E119 Type 2 diabetes mellitus without complications: Secondary | ICD-10-CM | POA: Diagnosis not present

## 2024-02-21 LAB — HM DIABETES EYE EXAM

## 2024-03-13 ENCOUNTER — Ambulatory Visit
Admission: RE | Admit: 2024-03-13 | Discharge: 2024-03-13 | Disposition: A | Discharge status: Home / Self Care | Source: Ambulatory Visit | Attending: Nurse Practitioner | Admitting: Nurse Practitioner

## 2024-03-13 DIAGNOSIS — M810 Age-related osteoporosis without current pathological fracture: Secondary | ICD-10-CM

## 2024-03-13 DIAGNOSIS — M8589 Other specified disorders of bone density and structure, multiple sites: Secondary | ICD-10-CM | POA: Diagnosis not present

## 2024-03-13 DIAGNOSIS — Z1231 Encounter for screening mammogram for malignant neoplasm of breast: Secondary | ICD-10-CM | POA: Diagnosis not present

## 2024-03-13 DIAGNOSIS — Z780 Asymptomatic menopausal state: Secondary | ICD-10-CM | POA: Diagnosis not present

## 2024-03-14 ENCOUNTER — Ambulatory Visit: Payer: Self-pay | Admitting: Nurse Practitioner

## 2024-03-15 NOTE — Progress Notes (Signed)
 Contacted via MyChart   Normal mammogram, may repeat in one year:)

## 2024-06-14 ENCOUNTER — Telehealth: Payer: Self-pay

## 2024-06-14 NOTE — Progress Notes (Signed)
° °  06/14/2024  Patient ID: Susan Pineda, female   DOB: Nov 29, 1955, 68 y.o.   MRN: [id number]  This patient is appearing on a report for being at risk of failing the adherence measure for cholesterol (statin), diabetes, and hypertension (ACEi/ARB) medications this calendar year.   Medication: metformin  500mg , pravastatin  40mg , lisinopril  5mg  Last fill date: 05/04/24 for 90 day supply  Insurance report was not up to date. No action needed at this time.   Channing DELENA Mealing, PharmD, DPLA

## 2024-08-14 ENCOUNTER — Encounter: Admitting: Nurse Practitioner
